# Patient Record
Sex: Female | Born: 1955
Health system: Southern US, Community
[De-identification: ages and names within clinical notes are randomized; demographics above are authoritative.]

## PROBLEM LIST (undated history)

## (undated) DIAGNOSIS — R112 Nausea with vomiting, unspecified: Secondary | ICD-10-CM

## (undated) DIAGNOSIS — T8859XA Other complications of anesthesia, initial encounter: Secondary | ICD-10-CM

## (undated) DIAGNOSIS — T4145XA Adverse effect of unspecified anesthetic, initial encounter: Secondary | ICD-10-CM

## (undated) DIAGNOSIS — K5190 Ulcerative colitis, unspecified, without complications: Secondary | ICD-10-CM

## (undated) DIAGNOSIS — I10 Essential (primary) hypertension: Secondary | ICD-10-CM

## (undated) DIAGNOSIS — Z801 Family history of malignant neoplasm of trachea, bronchus and lung: Secondary | ICD-10-CM

## (undated) DIAGNOSIS — Z9889 Other specified postprocedural states: Secondary | ICD-10-CM

## (undated) DIAGNOSIS — K589 Irritable bowel syndrome without diarrhea: Secondary | ICD-10-CM

## (undated) DIAGNOSIS — K219 Gastro-esophageal reflux disease without esophagitis: Secondary | ICD-10-CM

## (undated) DIAGNOSIS — Z803 Family history of malignant neoplasm of breast: Secondary | ICD-10-CM

## (undated) DIAGNOSIS — F419 Anxiety disorder, unspecified: Secondary | ICD-10-CM

## (undated) DIAGNOSIS — C801 Malignant (primary) neoplasm, unspecified: Secondary | ICD-10-CM

## (undated) HISTORY — DX: Family history of malignant neoplasm of trachea, bronchus and lung: Z80.1

## (undated) HISTORY — PX: TUBAL LIGATION: SHX77

## (undated) HISTORY — DX: Essential (primary) hypertension: I10

## (undated) HISTORY — PX: TONSILLECTOMY: SHX5217

## (undated) HISTORY — DX: Irritable bowel syndrome, unspecified: K58.9

## (undated) HISTORY — DX: Family history of malignant neoplasm of breast: Z80.3

## (undated) HISTORY — DX: Ulcerative colitis, unspecified, without complications: K51.90

---
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
## 2009-12-27 ENCOUNTER — Other Ambulatory Visit: Admission: RE | Admit: 2009-12-27 | Discharge: 2009-12-27 | Payer: Self-pay | Admitting: Radiology

## 2017-08-22 DIAGNOSIS — E785 Hyperlipidemia, unspecified: Secondary | ICD-10-CM | POA: Insufficient documentation

## 2018-04-14 ENCOUNTER — Ambulatory Visit (INDEPENDENT_AMBULATORY_CARE_PROVIDER_SITE_OTHER): Payer: Self-pay | Admitting: Orthopaedic Surgery

## 2018-04-21 ENCOUNTER — Ambulatory Visit (INDEPENDENT_AMBULATORY_CARE_PROVIDER_SITE_OTHER): Payer: BLUE CROSS/BLUE SHIELD

## 2018-04-21 ENCOUNTER — Encounter (INDEPENDENT_AMBULATORY_CARE_PROVIDER_SITE_OTHER): Payer: Self-pay | Admitting: Orthopaedic Surgery

## 2018-04-21 ENCOUNTER — Ambulatory Visit (INDEPENDENT_AMBULATORY_CARE_PROVIDER_SITE_OTHER): Payer: BLUE CROSS/BLUE SHIELD | Admitting: Orthopaedic Surgery

## 2018-04-21 DIAGNOSIS — G8929 Other chronic pain: Secondary | ICD-10-CM

## 2018-04-21 DIAGNOSIS — M25561 Pain in right knee: Secondary | ICD-10-CM | POA: Diagnosis not present

## 2018-04-21 DIAGNOSIS — M898X6 Other specified disorders of bone, lower leg: Secondary | ICD-10-CM

## 2018-04-21 MED ORDER — DICLOFENAC SODIUM 1 % TD GEL: 2.0000 g | Freq: Four times a day (QID) | TRANSDERMAL | 5 refills | Status: DC

## 2018-04-21 NOTE — Progress Notes (Signed)
Office Visit Note   Patient: Donna Whitney           Date of Birth: 02/07/1956           MRN: [id number] Visit Date: 04/21/2018              Requested by: No referring provider defined for this encounter. PCP: Asencion Noble, MD   Assessment & Plan: Visit Diagnoses:  1. Chronic pain of right knee   2. Pain of right tibia     Plan: Impression is mild right knee osteoarthritis and right tibial shaft pain.  The knee this is overall mild and I have given her a prescription for Voltaren gel.  She has a history of ulcerative colitis therefore she should not take NSAIDs.  For the tibia like to protect her weightbearing for the next 4 weeks with a Cam walking boot.  Hopefully this will settle it down.  I think this is either tendinitis from overuse or possibly a stress reaction or stress fracture.  I like to reevaluate in 4 weeks to see how she is doing.  May need to consider an MRI if she is not any better.  Follow-Up Instructions: Return in about 1 month (around 05/19/2018).   Orders:  Orders Placed This Encounter  Procedures  . XR Knee Complete 4 Views Right  . XR Tibia/Fibula Right   Meds ordered this encounter  Medications  . diclofenac sodium (VOLTAREN) 1 % GEL    Sig: Apply 2 g topically 4 (four) times daily.    Dispense:  1 Tube    Refill:  5      Procedures: No procedures performed   Clinical Data: No additional findings.   Subjective: Chief Complaint  Patient presents with  . Right Knee - Pain    Donna Whitney is a very pleasant 62 year old female comes in with right knee and shin pain.  She is the husband of Donna Whitney who is hip replacement performed a couple years back.  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  She has been walking a lot.  She states that there is discomfort in her right knee that will sometimes radiate to the back of the knee.  Denies any significant swelling or chest pain or shortness of breath.  She will  have some pain at rest in her shin bone near her ankle.   Review of Systems  Constitutional: Negative.   HENT: Negative.   Eyes: Negative.   Respiratory: Negative.   Cardiovascular: Negative.   Endocrine: Negative.   Musculoskeletal: Negative.   Neurological: Negative.   Hematological: Negative.   Psychiatric/Behavioral: Negative.   All other systems reviewed and are negative.    Objective: Vital Signs: There were no vitals taken for this visit.  Physical Exam  Constitutional: She is oriented to person, place, and time. She appears well-developed and well-nourished.  HENT:  Head: Normocephalic and atraumatic.  Eyes: EOM are normal.  Neck: Neck supple.  Pulmonary/Chest: Effort normal.  Abdominal: Soft.  Neurological: She is alert and oriented to person, place, and time.  Skin: Skin is warm. Capillary refill takes less than 2 seconds.  Psychiatric: She has a normal mood and affect. Her behavior is normal. Judgment and thought content normal.  Nursing note and vitals reviewed.   Ortho Exam Right knee exam shows no joint effusion.  Collaterals and cruciates are stable.  1+ patellofemoral crepitus.  No joint line tenderness.  Right tibia exam  shows mild pitting edema that is symmetric.  No neurovascular compromise.  She is slightly tender in the tibial crest.  No significant pain with resisted ankle dorsiflexion. Specialty Comments:  No specialty comments available.  Imaging: Xr Knee Complete 4 Views Right  Result Date: 04/21/2018 Mild arthritis.  No significant joint space narrowing  Xr Tibia/fibula Right  Result Date: 04/21/2018 No acute or structural abnormalities.    PMFS History: There are no active problems to display for this patient.  History reviewed. No pertinent past medical history.  History reviewed. No pertinent family history.  History reviewed. No pertinent surgical history. Social History   Occupational History  . Not on file  Tobacco Use  .  Smoking status: Never Smoker  . Smokeless tobacco: Never Used  Substance and Sexual Activity  . Alcohol use: Not on file  . Drug use: Not on file  . Sexual activity: Not on file

## 2018-05-01 ENCOUNTER — Ambulatory Visit (HOSPITAL_COMMUNITY)
Admission: RE | Admit: 2018-05-01 | Discharge: 2018-05-01 | Disposition: A | Discharge status: Home / Self Care | Payer: BLUE CROSS/BLUE SHIELD | Source: Ambulatory Visit | Attending: Internal Medicine | Admitting: Internal Medicine

## 2018-05-01 ENCOUNTER — Other Ambulatory Visit (HOSPITAL_COMMUNITY): Payer: Self-pay | Admitting: Internal Medicine

## 2018-05-01 DIAGNOSIS — M79661 Pain in right lower leg: Secondary | ICD-10-CM

## 2018-05-01 DIAGNOSIS — M7989 Other specified soft tissue disorders: Secondary | ICD-10-CM

## 2018-05-01 DIAGNOSIS — R2241 Localized swelling, mass and lump, right lower limb: Secondary | ICD-10-CM | POA: Insufficient documentation

## 2018-05-01 DIAGNOSIS — I82402 Acute embolism and thrombosis of unspecified deep veins of left lower extremity: Secondary | ICD-10-CM | POA: Insufficient documentation

## 2018-05-19 ENCOUNTER — Encounter (INDEPENDENT_AMBULATORY_CARE_PROVIDER_SITE_OTHER): Payer: Self-pay | Admitting: Orthopaedic Surgery

## 2018-05-19 ENCOUNTER — Ambulatory Visit (INDEPENDENT_AMBULATORY_CARE_PROVIDER_SITE_OTHER): Payer: BLUE CROSS/BLUE SHIELD | Admitting: Orthopaedic Surgery

## 2018-05-19 DIAGNOSIS — M898X6 Other specified disorders of bone, lower leg: Secondary | ICD-10-CM | POA: Diagnosis not present

## 2018-05-19 NOTE — Progress Notes (Signed)
   Office Visit Note   Patient: Donna Whitney           Date of Birth: 1955-12-22           MRN: [id number] Visit Date: 05/19/2018              Requested by: Asencion Noble, MD [address] [place], [place] [address] PCP: Asencion Noble, MD   Assessment & Plan: Visit Diagnoses:  1. Pain of right tibia     Plan: At this point we are going to try to wean her out of the cam walker to see how she does.  She has recurrence of pain immediately for any worsening then we need to get an MRI of the tibi to rule out stress fracture.a if she continues to improve that she can follow-up as needed.  Follow-Up Instructions: Return if symptoms worsen or fail to improve.   Orders:  No orders of the defined types were placed in this encounter.  No orders of the defined types were placed in this encounter.     Procedures: No procedures performed   Clinical Data: No additional findings.   Subjective: Chief Complaint  Patient presents with  . Right Knee - Follow-up  . Right Leg - Follow-up    Donna Whitney follows up today for her right ankle and tibia.  She feels much better.  She states she is about 90% better.  It has significantly helped.  She received a Doppler which was negative for DVT.   Review of Systems   Objective: Vital Signs: There were no vitals taken for this visit.  Physical Exam  Ortho Exam Right knee exam is unremarkable.  She still has some mild discomfort with palpation along the anterior tibial crest.  Pain is significantly better. Specialty Comments:  No specialty comments available.  Imaging: No results found.   PMFS History: There are no active problems to display for this patient.  History reviewed. No pertinent past medical history.  History reviewed. No pertinent family history.  History reviewed. No pertinent surgical history. Social History   Occupational History  . Not on file  Tobacco Use  . Smoking status: Never Smoker  . Smokeless tobacco:  Never Used  Substance and Sexual Activity  . Alcohol use: Not on file  . Drug use: Not on file  . Sexual activity: Not on file

## 2019-03-30 IMAGING — US US EXTREM LOW VENOUS*R*
1 series · 13 of 24 positions shown · non-contrast
Comparison: None.

CLINICAL DATA: 61-year-old female with right lower extremity pain
and swelling



[Series 1: us extrem low venous*right* · 0.08mm/px · 13 of 36 slices shown]
[im 1/36]
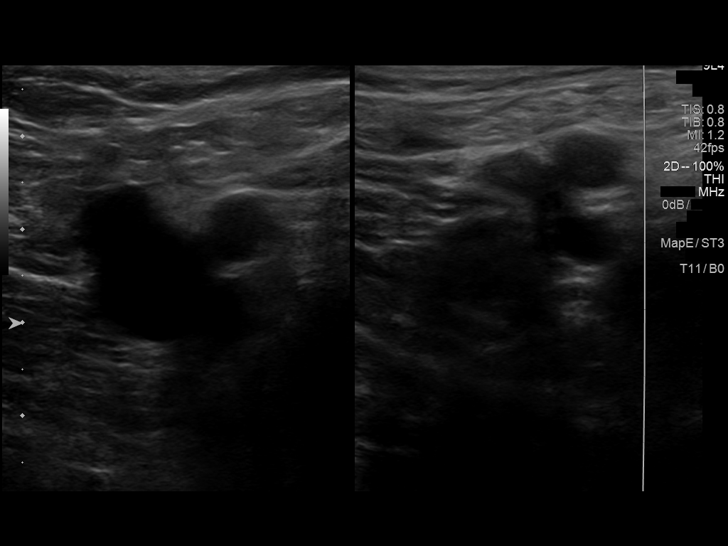
[im 4/36]
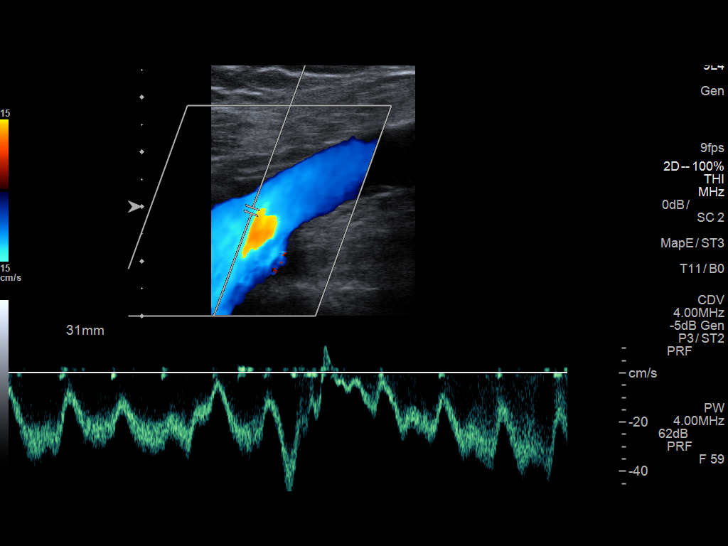
[im 7/36]
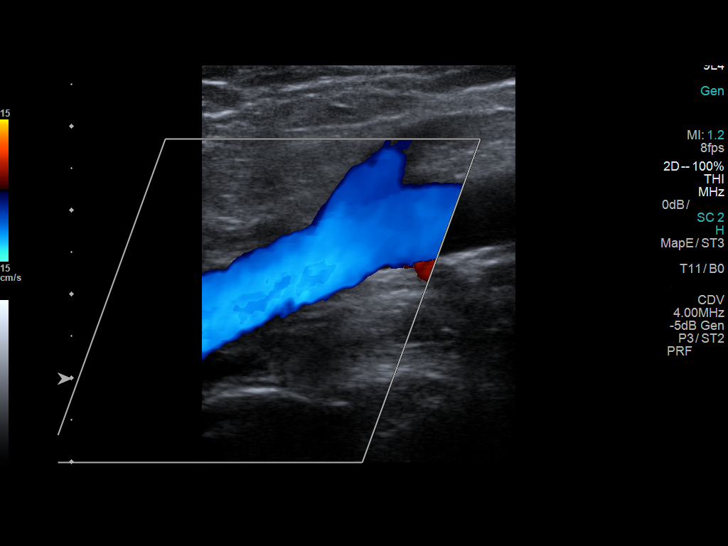
[im 10/36]
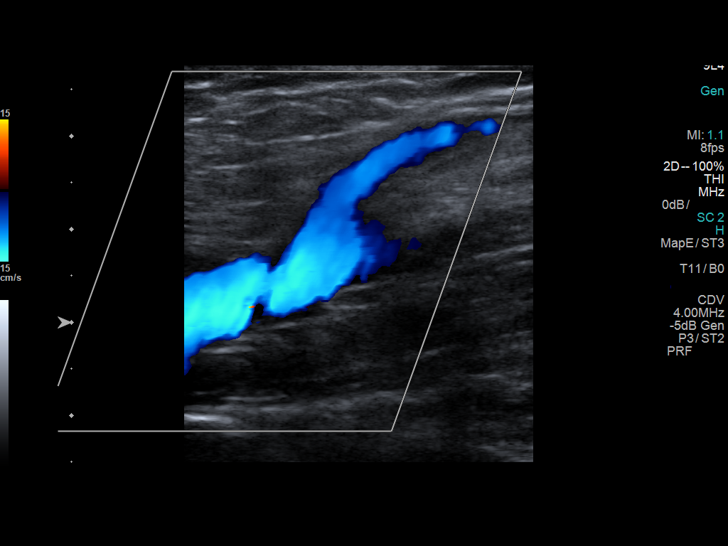
[im 13/36]
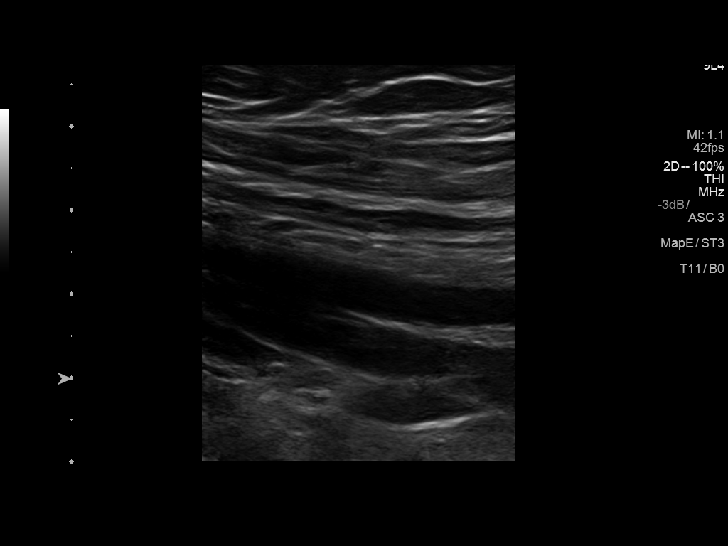
[im 16/36]
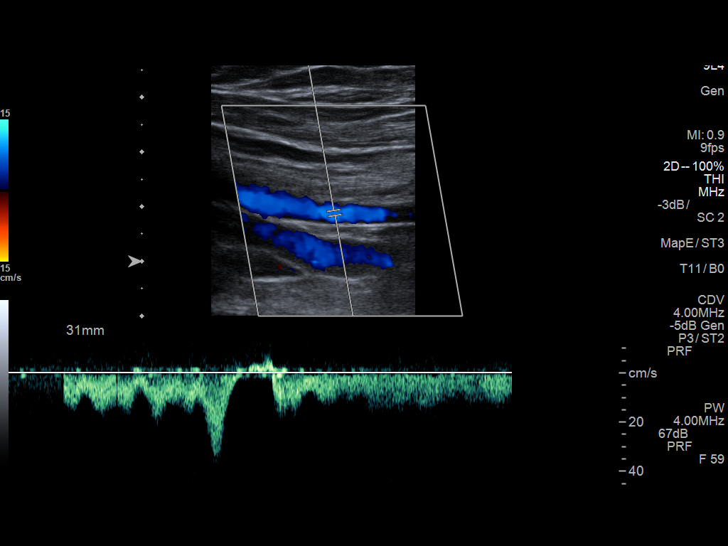
[im 19/36]
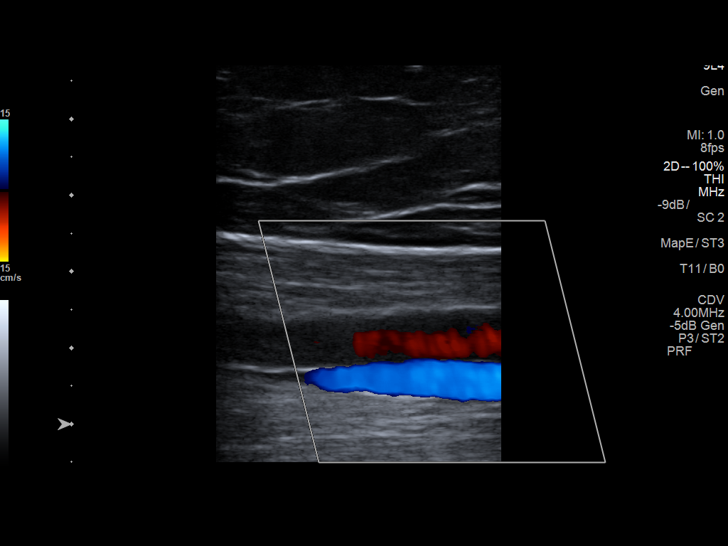
[im 20/36]
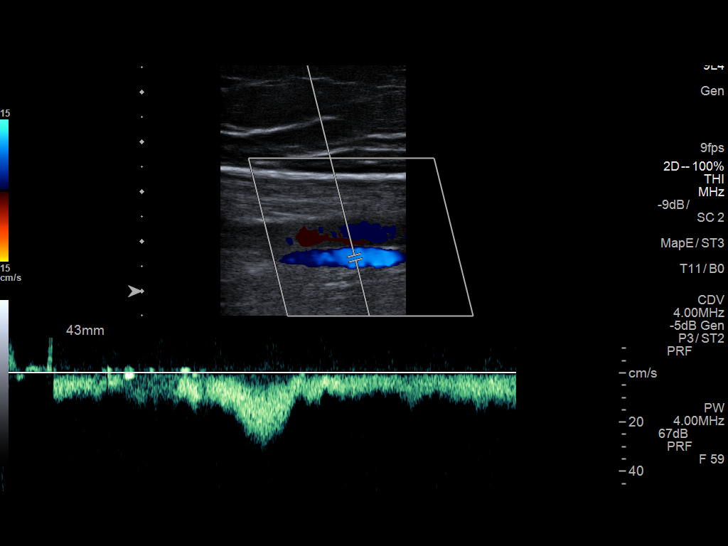
[im 23/36]
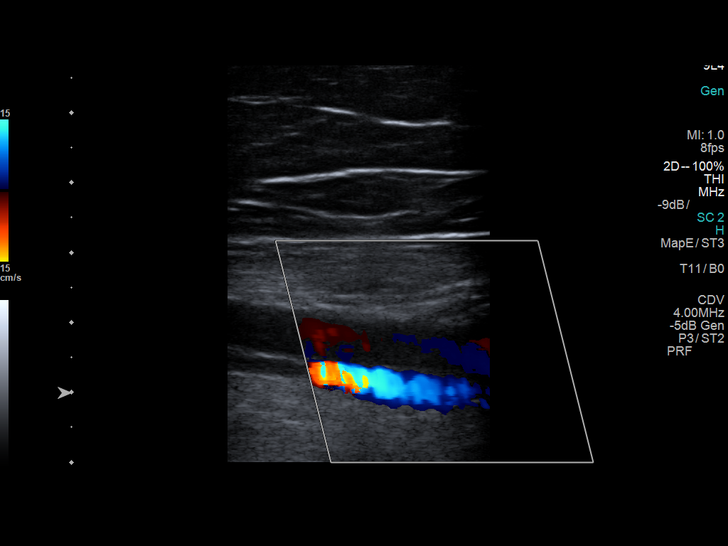
[im 26/36]
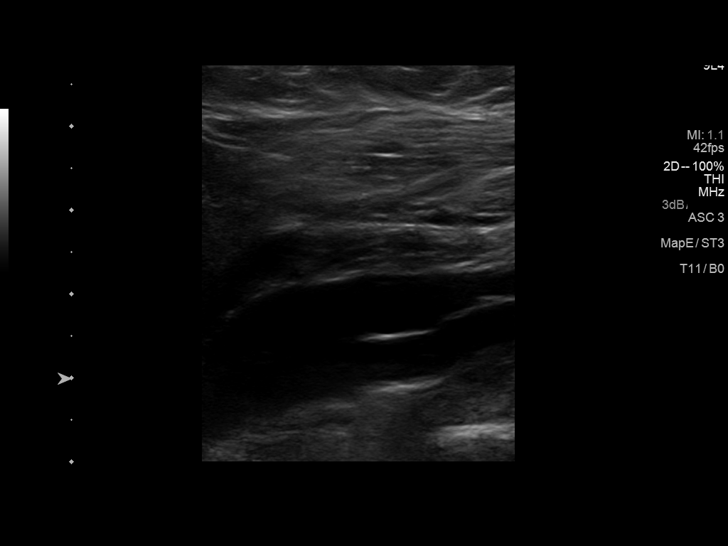
[im 29/36]
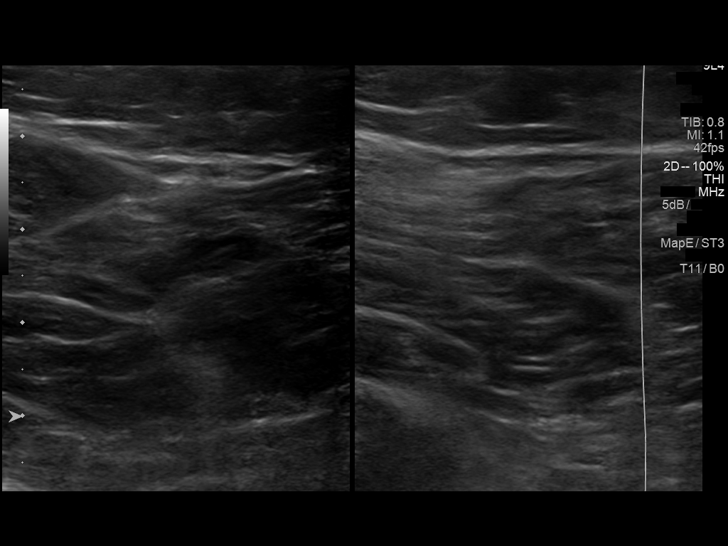
[im 32/36]
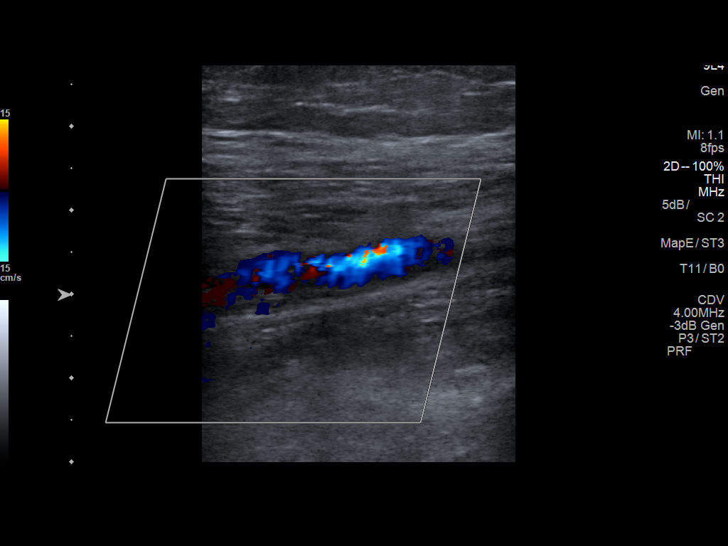
[im 36/36]
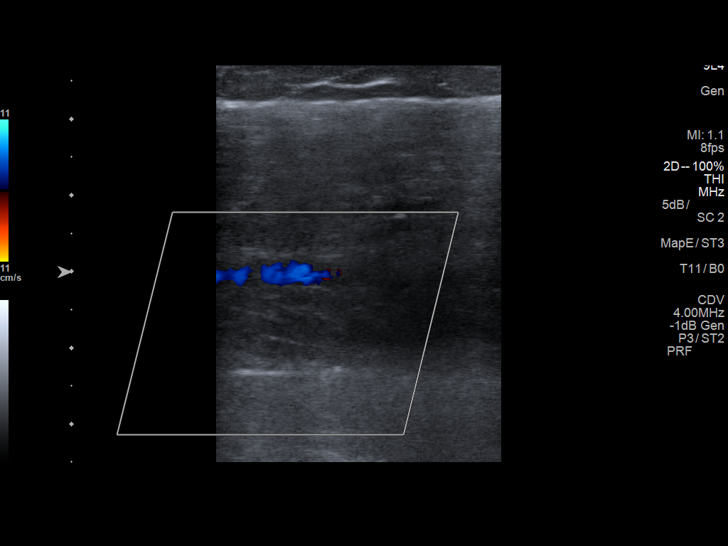

[13 of 24 positions shown; findings below may reference images not displayed]

FINDINGS: Contralateral Common Femoral Vein: Respiratory phasicity is normal
and symmetric with the symptomatic side. No evidence of thrombus.
Normal compressibility.

Common Femoral Vein: No evidence of thrombus. Normal
compressibility, respiratory phasicity and response to augmentation.

Saphenofemoral Junction: No evidence of thrombus. Normal
compressibility and flow on color Doppler imaging.

Profunda Femoral Vein: No evidence of thrombus. Normal
compressibility and flow on color Doppler imaging.

Femoral Vein: No evidence of thrombus. Normal compressibility,
respiratory phasicity and response to augmentation.

Popliteal Vein: No evidence of thrombus. Normal compressibility,
respiratory phasicity and response to augmentation.

Calf Veins: No evidence of thrombus. Normal compressibility and flow
on color Doppler imaging.

Superficial Great Saphenous Vein: No evidence of thrombus. Normal
compressibility.

Venous Reflux:  None.

Other Findings:  None.
IMPRESSION: No evidence of deep venous thrombosis.

## 2019-05-19 DIAGNOSIS — C801 Malignant (primary) neoplasm, unspecified: Secondary | ICD-10-CM

## 2019-05-19 HISTORY — DX: Malignant (primary) neoplasm, unspecified: C80.1

## 2019-06-09 ENCOUNTER — Other Ambulatory Visit: Payer: Self-pay | Admitting: Radiology

## 2019-06-11 ENCOUNTER — Telehealth: Payer: Self-pay | Admitting: *Deleted

## 2019-06-11 ENCOUNTER — Encounter: Payer: Self-pay | Admitting: *Deleted

## 2019-06-11 NOTE — Telephone Encounter (Signed)
Confirmed [facility] for 06/16/19 at 1215 .  Instructions and contact information given.

## 2019-06-11 NOTE — Telephone Encounter (Signed)
Left vm regarding [place] appt for 7/29 at 12:15. Contact information provided for confirmation of appt.

## 2019-06-14 ENCOUNTER — Other Ambulatory Visit: Payer: Self-pay | Admitting: *Deleted

## 2019-06-14 DIAGNOSIS — C50211 Malignant neoplasm of upper-inner quadrant of right female breast: Secondary | ICD-10-CM | POA: Insufficient documentation

## 2019-06-14 DIAGNOSIS — Z170 Estrogen receptor positive status [ER+]: Secondary | ICD-10-CM | POA: Insufficient documentation

## 2019-06-15 NOTE — Progress Notes (Signed)
[facility]  [phone] [phone]     ID: Donna Whitney DOB: 1956-09-24  MR#: [id number]  SAY#:[id number]  Patient Care Team: Asencion Noble, MD as PCP - General (Internal Medicine) Mauro Kaufmann, RN as Oncology Nurse Navigator Rockwell Germany, RN as Oncology Nurse Navigator Erroll Luna, MD as Consulting Physician (General Surgery) Auriel Kist, Virgie Dad, MD as Consulting Physician (Oncology) Kyung Rudd, MD as Consulting Physician (Radiation Oncology) Linda Hedges, DO as Consulting Physician (Obstetrics and Gynecology) Richmond Campbell, MD as Consulting Physician (Gastroenterology) Chauncey Cruel, MD OTHER MD:  CHIEF COMPLAINT: estrogen receptor positive breast cancer  CURRENT TREATMENT: Definitive surgery pending   HISTORY OF CURRENT ILLNESS: [name] presented with a 2 week history of a palpable area in the upper-inner quadrant of her right breast. She underwent bilateral diagnostic mammography with tomography and right breast ultrasonography at [facility] on 06/03/2019 showing: breast density category B; 7 mm lobulated region in the upper-inner right breast suspicious of malignancy.  Evaluation of the right axilla if performed was not documented in the ultrasound report.  Accordingly on 06/09/2019 she proceeded to biopsy of the right breast area in question. The pathology from this procedure ([id number]) showed: invasive ductal carcinoma, grade 3. Prognostic indicators significant for: estrogen receptor, 100% positive with strong staining intensity, and progesterone receptor, 30% positive, with weak staining intensity. Proliferation marker Ki67 at 10%. HER2 equivocal by immunohistochemistry (2+), but negative by fluorescent in situ hybridization. Two sets of cells were evaluated showing a signals ratio 1.63/1.62 and number per cell 4.25/4.5.  The patient's subsequent history is as detailed below.   INTERVAL HISTORY: Rhealyn was evaluated in  the multidisciplinary breast cancer clinic on 06/16/2019. Her case was also presented at the multidisciplinary breast cancer conference on the same day. At that time a preliminary plan was proposed: Breast conserving surgery, Oncotype, adjuvant radiation, antiestrogens and genetics testing   REVIEW OF SYSTEMS: Donna Whitney reports she had a week of increased fatigue, and one day when she was lying down, she felt some stinging in her breast and palpated a pea-sized nodule. 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 The patient denies unusual headaches, visual changes, nausea, vomiting, stiff neck, dizziness, or gait imbalance. There has been no cough, phlegm production, or pleurisy, no chest pain or pressure, and no change in bowel or bladder habits. The patient denies fever, rash, bleeding, unexplained fatigue or unexplained weight loss. A detailed review of systems was otherwise entirely negative.   PAST MEDICAL HISTORY: Past Medical History:  Diagnosis Date   Hypertension    IBS (irritable bowel syndrome)    Ulcerative colitis ([place])     PAST SURGICAL HISTORY: Past Surgical History:  Procedure Laterality Date   CESAREAN SECTION     TONSILLECTOMY     as a child   TUBAL LIGATION      FAMILY HISTORY: Family History  Problem Relation Age of Onset   Lung cancer Maternal Grandmother    Breast cancer Paternal 49    Patient's father was 34 years old when he died from MRSA following back surgery, complicated by heavy smoking and a prior heart attack. Patient's mother died from heart attack at age 35. The patient denies a family hx of ovarian cancer. She reports a paternal aunt with breast cancer. She has 3 siblings, 1 brother and 2 sisters. She also notes lung cancer in her maternal grandmother.   GYNECOLOGIC HISTORY:  No  LMP recorded. Menarche: 63 years old Age at first live birth: 63 years old [place] P 2 LMP 11/2013 Contraceptive: yes, stopped  after 6 months due to elevated blood pressure HRT no  Hysterectomy? no BSO? 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 She is married. Husband Donna Whitney is her partner in this. 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 She lives at home with her husband. Son Donna Whitney, age 42, lives in [place] as a Development worker, international aid. Daughter Donna Whitney, age 86, lives in [place] as a Training and development officer for [employer]. She has two grandchildren. She attends [employer].    ADVANCED DIRECTIVES: Husband Donna Whitney is her HCPOA.   HEALTH MAINTENANCE: Social History   Tobacco Use   Smoking status: Former Smoker   Smokeless tobacco: Never Used  Substance Use Topics   Alcohol use: Yes   Drug use: Never     Colonoscopy: ~2016, Dr. Earlean Shawl  PAP: 01/2018  Bone density: ~2018, [facility], "early osteoporosis"   No Known Allergies  Current Outpatient Medications  Medication Sig Dispense Refill   famotidine (PEPCID) 20 MG tablet Take 20 mg by mouth daily. Pt only takes as needed     methocarbamol (ROBAXIN) 500 MG tablet Take 500 mg by mouth every 6 (six) hours as needed for muscle spasms.     NON FORMULARY Take 1 tablet by mouth daily. [place] High Potency Probiotic     olmesartan (BENICAR) 20 MG tablet Take 20 mg by mouth daily.     No current facility-administered medications for this visit.     OBJECTIVE: Middle-aged white woman who appears stated age  63:   06/16/19 1245  BP: (!) 172/92  Pulse: 86  Resp: 18  Temp: 99.1 F (37.3 C)  SpO2: 100%     Body mass index is 25.63 kg/m.   Wt Readings from Last 3 Encounters:  06/16/19 149 lb 4.8 oz (67.7 kg)      ECOG FS:1 - Symptomatic but completely ambulatory  Ocular: Sclerae unicteric, pupils round and equal Wearing a mask Lymphatic: No cervical or supraclavicular adenopathy Lungs no rales or rhonchi Heart regular rate and rhythm Abd soft,  nontender, positive bowel sounds MSK no focal spinal tenderness, no joint edema Neuro: non-focal, well-oriented, appropriate affect Breasts: The right breast has a significant ecchymosis from the recent biopsy and a palpable mass which is probably a hematoma.  The left breast is benign.  Both axillae are benign.   LAB RESULTS:  CMP     Component Value Date/Time   NA 140 06/16/2019 1218   K 4.0 06/16/2019 1218   CL 104 06/16/2019 1218   CO2 26 06/16/2019 1218   GLUCOSE 95 06/16/2019 1218   BUN 9 06/16/2019 1218   CREATININE 0.80 06/16/2019 1218   CALCIUM 9.8 06/16/2019 1218   PROT 7.5 06/16/2019 1218   ALBUMIN 4.6 06/16/2019 1218   AST 15 06/16/2019 1218   ALT 14 06/16/2019 1218   ALKPHOS 57 06/16/2019 1218   BILITOT 0.8 06/16/2019 1218   GFRNONAA >60 06/16/2019 1218   GFRAA >60 06/16/2019 1218    No results found for: TOTALPROTELP, ALBUMINELP, A1GS, A2GS, BETS, BETA2SER, GAMS, MSPIKE, SPEI  No results found for: KPAFRELGTCHN, LAMBDASER, KAPLAMBRATIO  Lab Results  Component Value Date   WBC 8.7 06/16/2019   NEUTROABS 5.5 06/16/2019   HGB 12.5 06/16/2019   HCT 38.2 06/16/2019   MCV 87.6 06/16/2019  PLT 241 06/16/2019    _0 @  No results found for: LABCA2  No components found for: JEHUDJ497  No results for input(s): INR in the last 168 hours.  No results found for: LABCA2  No results found for: WYO378  No results found for: HYI502  No results found for: DXA128  No results found for: CA2729  No components found for: HGQUANT  No results found for: CEA1 / No results found for: CEA1   No results found for: AFPTUMOR  No results found for: CHROMOGRNA  No results found for: PSA1  Appointment on 06/16/2019  Component Date Value Ref Range Status   Sodium 06/16/2019 140  135 - 145 mmol/L Final   Potassium 06/16/2019 4.0  3.5 - 5.1 mmol/L Final   Chloride 06/16/2019 104  98 - 111 mmol/L Final   CO2 06/16/2019 26  22 - 32 mmol/L Final    Glucose, Bld 06/16/2019 95  70 - 99 mg/dL Final   BUN 06/16/2019 9  8 - 23 mg/dL Final   Creatinine 06/16/2019 0.80  0.44 - 1.00 mg/dL Final   Calcium 06/16/2019 9.8  8.9 - 10.3 mg/dL Final   Total Protein 06/16/2019 7.5  6.5 - 8.1 g/dL Final   Albumin 06/16/2019 4.6  3.5 - 5.0 g/dL Final   AST 06/16/2019 15  15 - 41 U/L Final   ALT 06/16/2019 14  0 - 44 U/L Final   Alkaline Phosphatase 06/16/2019 57  38 - 126 U/L Final   Total Bilirubin 06/16/2019 0.8  0.3 - 1.2 mg/dL Final   GFR, Est Non Af Am 06/16/2019 >60  >60 mL/min Final   GFR, Est AFR Am 06/16/2019 >60  >60 mL/min Final   Anion gap 06/16/2019 10  5 - 15 Final   Performed at [facility], [place] [address] [place], [place] [address]   WBC Count 06/16/2019 8.7  4.0 - 10.5 K/uL Final   RBC 06/16/2019 4.36  3.87 - 5.11 MIL/uL Final   Hemoglobin 06/16/2019 12.5  12.0 - 15.0 g/dL Final   HCT 06/16/2019 38.2  36.0 - 46.0 % Final   MCV 06/16/2019 87.6  80.0 - 100.0 fL Final   MCH 06/16/2019 28.7  26.0 - 34.0 pg Final   MCHC 06/16/2019 32.7  30.0 - 36.0 g/dL Final   RDW 06/16/2019 13.6  11.5 - 15.5 % Final   Platelet Count 06/16/2019 241  150 - 400 K/uL Final   nRBC 06/16/2019 0.0  0.0 - 0.2 % Final   Neutrophils Relative % 06/16/2019 63  % Final   Neutro Abs 06/16/2019 5.5  1.7 - 7.7 K/uL Final   Lymphocytes Relative 06/16/2019 28  % Final   Lymphs Abs 06/16/2019 2.4  0.7 - 4.0 K/uL Final   Monocytes Relative 06/16/2019 7  % Final   Monocytes Absolute 06/16/2019 0.6  0.1 - 1.0 K/uL Final   Eosinophils Relative 06/16/2019 1  % Final   Eosinophils Absolute 06/16/2019 0.1  0.0 - 0.5 K/uL Final   Basophils Relative 06/16/2019 1  % Final   Basophils Absolute 06/16/2019 0.0  0.0 - 0.1 K/uL Final   Immature Granulocytes 06/16/2019 0  % Final   Abs Immature Granulocytes 06/16/2019 0.03  0.00 - 0.07 K/uL Final   Performed at [facility], [place] [address] [place], [place] [address]    (this displays the last labs from the last 3 days)  No results found for: TOTALPROTELP, ALBUMINELP, A1GS, A2GS, BETS, BETA2SER,  GAMS, MSPIKE, SPEI (this displays SPEP labs)  No results found for: KPAFRELGTCHN, LAMBDASER, KAPLAMBRATIO (kappa/lambda light chains)  No results found for: HGBA, HGBA2QUANT, HGBFQUANT, HGBSQUAN (Hemoglobinopathy evaluation)   No results found for: LDH  No results found for: IRON, TIBC, IRONPCTSAT (Iron and TIBC)  No results found for: FERRITIN  Urinalysis No results found for: COLORURINE, APPEARANCEUR, LABSPEC, PHURINE, GLUCOSEU, HGBUR, BILIRUBINUR, KETONESUR, PROTEINUR, UROBILINOGEN, NITRITE, LEUKOCYTESUR   STUDIES: No results found.  ELIGIBLE FOR AVAILABLE RESEARCH PROTOCOL: no  ASSESSMENT: 63 y.o. [place]us [place], [place] woman status post right breast upper inner quadrant biopsy 06/09/2019 for a clinical T1b NX, stage IA invasive ductal carcinoma, grade 3, estrogen and progesterone receptor positive, HER-2 nonamplified, with an MRD-1 of 10%.  (1) definitive surgery pending  (2) Oncotype to be obtained from the definitive surgical sample: Chemotherapy not anticipated  (3) adjuvant radiation  (4) antiestrogens to start at the completion of local treatment.  (5) genetics testing  PLAN: I spent approximately 60 minutes face to face with Anelia with more than 50% of that time spent in counseling and coordination of care. Specifically we reviewed the biology of the patient's diagnosis and the specifics of her situation.  We first reviewed the fact that cancer is not one disease but more than 100 different diseases and that it is important to keep them separate-- otherwise when friends and relatives discuss their own cancer experiences with Rendi confusion can result. Similarly we explained that if breast cancer spreads to the bone or liver, the patient would not have bone cancer or liver cancer, but breast cancer in the bone and  breast cancer in the liver: one cancer in three places-- not 3 different cancers which otherwise would have to be treated in 3 different ways.  We discussed the difference between local and systemic therapy. In terms of loco-regional treatment, lumpectomy plus radiation is equivalent to mastectomy as far as survival is concerned. For this reason, and because the cosmetic results are generally superior, we recommend breast conserving surgery.   We then discussed the rationale for systemic therapy. There is some risk that this cancer may have already spread to other parts of her body. Patients frequently ask at this point about bone scans, CAT scans and PET scans to find out if they have occult breast cancer somewhere else. The problem is that in early stage disease we are much more likely to find false positives then true cancers and this would expose the patient to unnecessary procedures as well as unnecessary radiation. Scans cannot answer the question the patient really would like to know, which is whether she has microscopic disease elsewhere in her body. For those reasons we do not recommend them.  Of course we would proceed to aggressive evaluation of any symptoms that might suggest metastatic disease, but that is not the case here.  Next we went over the options for systemic therapy which are anti-estrogens, anti-HER-2 immunotherapy, and chemotherapy. Solange does not meet criteria for anti-HER-2 immunotherapy. She is a good candidate for anti-estrogens.  The question of chemotherapy is more complicated. Chemotherapy is most effective in aggressive tumors, like this 1. It is much less effective in slow growing cancers, like Velmer 's.  In chart we cannot predict the benefit of chemotherapy from the data we have and we are going to request an Oncotype from the definitive surgical sample, as suggested by NCCN guidelines. That will help [place] make a definitive decision regarding chemotherapy in this  case.  She also qualifies for  genetics testing. In patients who carry a deleterious mutation [for example in a  BRCA gene], the risk of a new breast cancer developing in the future may be sufficiently great that the patient may choose bilateral mastectomies. However if she wishes to keep her breasts in that situation it is safe to do so. That would require intensified screening, which generally means not only yearly mammography but a yearly breast MRI as well.  Abrielle is very clear that even if she did carry a deleterious mutation she would prefer intensified screening.  The plan then is to start with surgery, use the Oncotype to help on the chemotherapy decision, and then follow-up with radiation and antiestrogens.  Rosetta has a good understanding of the overall plan. She agrees with it. She knows the goal of treatment in her case is cure. She will call with any problems that may develop before her next visit here.   Chauncey Cruel, MD   06/16/2019 2:43 PM Medical Oncology and Hematology [facility] [address] [place], [place] [address] Tel. [phone]    Fax. [phone]   This document serves as a record of services personally performed by Lurline Del, MD. It was created on his behalf by Wilburn Mylar, a trained medical scribe. The creation of this record is based on the scribe's personal observations and the provider's statements to them.   I, Lurline Del MD, have reviewed the above documentation for accuracy and completeness, and I agree with the above.

## 2019-06-16 ENCOUNTER — Encounter: Payer: Self-pay | Admitting: Oncology

## 2019-06-16 ENCOUNTER — Ambulatory Visit: Payer: BLUE CROSS/BLUE SHIELD | Admitting: Licensed Clinical Social Worker

## 2019-06-16 ENCOUNTER — Encounter: Payer: Self-pay | Admitting: Physical Therapy

## 2019-06-16 ENCOUNTER — Ambulatory Visit: Payer: BLUE CROSS/BLUE SHIELD | Attending: Surgery | Admitting: Physical Therapy

## 2019-06-16 ENCOUNTER — Inpatient Hospital Stay: Payer: BLUE CROSS/BLUE SHIELD

## 2019-06-16 ENCOUNTER — Ambulatory Visit: Payer: Self-pay | Admitting: Surgery

## 2019-06-16 ENCOUNTER — Ambulatory Visit
Admission: RE | Admit: 2019-06-16 | Discharge: 2019-06-16 | Disposition: A | Discharge status: Home / Self Care | Payer: BLUE CROSS/BLUE SHIELD | Source: Ambulatory Visit | Attending: Radiation Oncology | Admitting: Radiation Oncology

## 2019-06-16 ENCOUNTER — Encounter: Payer: Self-pay | Admitting: Licensed Clinical Social Worker

## 2019-06-16 ENCOUNTER — Other Ambulatory Visit: Payer: Self-pay

## 2019-06-16 ENCOUNTER — Inpatient Hospital Stay: Payer: BLUE CROSS/BLUE SHIELD | Attending: Oncology | Admitting: Oncology

## 2019-06-16 VITALS — BP 172/92 | HR 86 | Temp 99.1°F | Resp 18 | Ht 64.0 in | Wt 149.3 lb

## 2019-06-16 DIAGNOSIS — Z170 Estrogen receptor positive status [ER+]: Secondary | ICD-10-CM

## 2019-06-16 DIAGNOSIS — C50211 Malignant neoplasm of upper-inner quadrant of right female breast: Secondary | ICD-10-CM

## 2019-06-16 DIAGNOSIS — Z803 Family history of malignant neoplasm of breast: Secondary | ICD-10-CM | POA: Insufficient documentation

## 2019-06-16 DIAGNOSIS — R293 Abnormal posture: Secondary | ICD-10-CM | POA: Diagnosis present

## 2019-06-16 DIAGNOSIS — Z801 Family history of malignant neoplasm of trachea, bronchus and lung: Secondary | ICD-10-CM | POA: Insufficient documentation

## 2019-06-16 DIAGNOSIS — Z87891 Personal history of nicotine dependence: Secondary | ICD-10-CM

## 2019-06-16 DIAGNOSIS — C50911 Malignant neoplasm of unspecified site of right female breast: Secondary | ICD-10-CM

## 2019-06-16 LAB — CMP (CANCER CENTER ONLY)
ALT: 14 U/L (ref 0–44)
AST: 15 U/L (ref 15–41)
Albumin: 4.6 g/dL (ref 3.5–5.0)
Alkaline Phosphatase: 57 U/L (ref 38–126)
Anion gap: 10 (ref 5–15)
BUN: 9 mg/dL (ref 8–23)
CO2: 26 mmol/L (ref 22–32)
Calcium: 9.8 mg/dL (ref 8.9–10.3)
Chloride: 104 mmol/L (ref 98–111)
Creatinine: 0.8 mg/dL (ref 0.44–1.00)
GFR, Est AFR Am: >60 mL/min (ref >60)
GFR, Estimated: >60 mL/min (ref >60)
Glucose, Bld: 95 mg/dL (ref 70–99)
Potassium: 4 mmol/L (ref 3.5–5.1)
Sodium: 140 mmol/L (ref 135–145)
Total Bilirubin: 0.8 mg/dL (ref 0.3–1.2)
Total Protein: 7.5 g/dL (ref 6.5–8.1)

## 2019-06-16 LAB — CBC WITH DIFFERENTIAL (CANCER CENTER ONLY)
Abs Immature Granulocytes: 0.03 10*3/uL (ref 0.00–0.07)
Basophils Absolute: 0 10*3/uL (ref 0.0–0.1)
Basophils Relative: 1 %
Eosinophils Absolute: 0.1 10*3/uL (ref 0.0–0.5)
Eosinophils Relative: 1 %
HCT: 38.2 % (ref 36.0–46.0)
Hemoglobin: 12.5 g/dL (ref 12.0–15.0)
Immature Granulocytes: 0 %
Lymphocytes Relative: 28 %
Lymphs Abs: 2.4 10*3/uL (ref 0.7–4.0)
MCH: 28.7 pg (ref 26.0–34.0)
MCHC: 32.7 g/dL (ref 30.0–36.0)
MCV: 87.6 fL (ref 80.0–100.0)
Monocytes Absolute: 0.6 10*3/uL (ref 0.1–1.0)
Monocytes Relative: 7 %
Neutro Abs: 5.5 10*3/uL (ref 1.7–7.7)
Neutrophils Relative %: 63 %
Platelet Count: 241 10*3/uL (ref 150–400)
RBC: 4.36 MIL/uL (ref 3.87–5.11)
RDW: 13.6 % (ref 11.5–15.5)
WBC Count: 8.7 10*3/uL (ref 4.0–10.5)
nRBC: 0 % (ref 0.0–0.2)

## 2019-06-16 NOTE — Therapy (Signed)
[place], [place], [address] Phone: [phone]   Fax:  [phone]  Physical Therapy Evaluation  Patient Details  Name: Donna Whitney MRN: [id number] Date of Birth: March 16, 1956 Referring Provider (PT): Dr. Erroll Luna   Encounter Date: 06/16/2019  PT End of Session - 06/16/19 1602    Visit Number  1    Number of Visits  2    Date for PT Re-Evaluation  08/11/19    PT Start Time  5397    PT Stop Time  1457    PT Time Calculation (min)  25 min    Activity Tolerance  Patient tolerated treatment well    Behavior During Therapy  [facility] for tasks assessed/performed       Past Medical History:  Diagnosis Date  . Family history of breast cancer   . Family history of lung cancer   . Hypertension   . IBS (irritable bowel syndrome)   . Ulcerative colitis [facility])     Past Surgical History:  Procedure Laterality Date  . CESAREAN SECTION    . TONSILLECTOMY     as a child  . TUBAL LIGATION      There were no vitals filed for this visit.   Subjective Assessment - 06/16/19 1441    Subjective  Patient reports she is here today to be seen by her medical team for her newly diagnosed right breast cancer.    Pertinent History  Patient was diagnosed on 06/03/2019 with right grade III invasive ductal carcinoma breast cancer. It meausres 7 mm and is lcoated in the upper inner quadrant. It is ER/PR positive and HER2 negative with a Ki67 of 10%.    Currently in Pain?  No/denies         [facility] PT Assessment - 06/16/19 0001      Assessment   Medical Diagnosis  Right breast cancer    Referring Provider (PT)  Dr. Marcello Moores Cornett    Onset Date/Surgical Date  06/03/19    Hand Dominance  Right    Prior Therapy  none      Precautions   Precautions  Other (comment)    Precaution Comments  active cancer      Restrictions   Weight Bearing Restrictions  No      Balance Screen   Has the patient fallen in the past  6 months  No    Has the patient had a decrease in activity level because of a fear of falling?   No    Is the patient reluctant to leave their home because of a fear of falling?   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 [employer]  She walks 30 min/day      Cognition   Overall Cognitive Status  Within Functional Limits for tasks assessed      Posture/Postural Control   Posture/Postural Control  Postural limitations    Postural Limitations  Rounded Shoulders;Forward head      ROM / Strength   AROM / PROM / Strength  AROM;Strength      AROM  Overall AROM Comments  Cervical AROM is WNL    AROM Assessment Site  Shoulder    Right/Left Shoulder  Left;Right    Right Shoulder Extension  60 Degrees    Right Shoulder Flexion  147 Degrees    Right Shoulder ABduction  160 Degrees    Right Shoulder Internal Rotation  78 Degrees    Right Shoulder External Rotation  68 Degrees    Left Shoulder Extension  50 Degrees    Left Shoulder Flexion  158 Degrees    Left Shoulder ABduction  161 Degrees    Left Shoulder Internal Rotation  70 Degrees    Left Shoulder External Rotation  85 Degrees      Strength   Overall Strength  Within functional limits for tasks performed        LYMPHEDEMA/ONCOLOGY QUESTIONNAIRE - 06/16/19 1600      Type   Cancer Type  Right breast cancer      Lymphedema Assessments   Lymphedema Assessments  Upper extremities      Right Upper Extremity Lymphedema   10 cm Proximal to Olecranon Process  28 cm    Olecranon Process  24.1 cm    10 cm Proximal to Ulnar Styloid Process  19.5 cm    Just Proximal to Ulnar Styloid Process  14.7 cm    Across Hand at [employer]  18.1 cm    At [place] of 2nd Digit  5.8 cm      Left Upper Extremity Lymphedema   10 cm Proximal to Olecranon Process  27.3 cm    Olecranon Process  24.3 cm    10 cm Proximal to Ulnar Styloid Process  19.2 cm    Just Proximal to Ulnar Styloid Process  14.9 cm    Across Hand at [employer]  18.2 cm    At [place] of 2nd Digit  5.8 cm          Quick Dash - 06/16/19 0001    Open a tight or new jar  Mild difficulty    Do heavy household chores (wash walls, wash floors)  No difficulty    Carry a shopping bag or briefcase  No difficulty    Wash your back  No difficulty    Use a knife to cut food  No difficulty    Recreational activities in which you take some force or impact through your arm, shoulder, or hand (golf, hammering, tennis)  No difficulty    During the past week, to what extent has your arm, shoulder or hand problem interfered with your normal social activities with family, friends, neighbors, or groups?  Not at all    During the past week, to what extent has your arm, shoulder or hand problem limited your work or other regular daily activities  Not at all    Arm, shoulder, or hand pain.  None    Tingling (pins and needles) in your arm, shoulder, or hand  None    Difficulty Sleeping  No difficulty    DASH Score  2.27 %        Objective measurements completed on examination: See above findings.       Patient was instructed today in a home exercise program today for post op shoulder range of motion. These included active assist shoulder flexion in sitting, scapular retraction, wall walking with shoulder abduction, and hands behind head external rotation.  She was encouraged to do these twice a day, holding 3 seconds  and repeating 5 times when permitted by her physician.           PT Education - 06/16/19 1601    Education Details  Lymphedema risk reduction and HEP education    Person(s) Educated  Patient    Methods  Explanation;Demonstration;Handout     Comprehension  Verbalized understanding;Returned demonstration          PT Long Term Goals - 06/16/19 1608      PT LONG TERM GOAL #1   Title  Patient will demonstrate she has regained full shoulder ROM and function post operatively compared to baseline assessment.    Time  8    Period  Weeks    Status  New      Breast Clinic Goals - 06/16/19 1608      Patient will be able to verbalize understanding of pertinent lymphedema risk reduction practices relevant to her diagnosis specifically related to skin care.   Time  1    Period  Days    Status  Achieved      Patient will be able to return demonstrate and/or verbalize understanding of the post-op home exercise program related to regaining shoulder range of motion.   Time  1    Period  Days    Status  Achieved      Patient will be able to verbalize understanding of the importance of attending the postoperative After Breast Cancer Class for further lymphedema risk reduction education and therapeutic exercise.   Time  1    Period  Days    Status  Achieved            Plan - 06/16/19 1603    Clinical Impression Statement  Patient was diagnosed on 06/03/2019 with right grade III invasive ductal carcinoma breast cancer. It meausres 7 mm and is lcoated in the upper inner quadrant. It is ER/PR positive and HER2 negative with a Ki67 of 10%. Her multidisciplinary medical team met prior to her assessments to determine a recommended treatment plan. She is planning to have a right lumpectomy and sentinel node biopsy followed by Oncotype testing if > 5 mm, radiation, and anti-estrogen therapy. She will benefit from a post op PT visit to reassess and determine needs.    Stability/Clinical Decision Making  Stable/Uncomplicated    Clinical Decision Making  Low    Rehab Potential  Excellent    PT Frequency  --   Eval and 1 f/u visit   PT Treatment/Interventions  ADLs/Self Care Home Management;Patient/family education;Therapeutic exercise     PT Next Visit Plan  Will reassess 3-4 weeks post op to determine needs    PT Home Exercise Plan  Post op shoulder ROM HEP    Consulted and Agree with Plan of Care  Patient       Patient will benefit from skilled therapeutic intervention in order to improve the following deficits and impairments:  Pain, Impaired UE functional use, Decreased knowledge of precautions, Postural dysfunction, Decreased range of motion  Visit Diagnosis: 1. Malignant neoplasm of upper-inner quadrant of right breast in female, estrogen receptor positive ([place])   2. Abnormal posture      Patient will follow up at outpatient cancer rehab 3-4 weeks following surgery.  If the patient requires physical therapy at that time, a specific plan will be dictated and sent to the referring physician for approval. The patient was educated today on appropriate basic range of motion exercises to begin post operatively and the importance of attending the  After Breast Cancer class following surgery.  Patient was educated today on lymphedema risk reduction practices as it pertains to recommendations that will benefit the patient immediately following surgery.  She verbalized good understanding.      Problem List Patient Active Problem List   Diagnosis Date Noted  . Family history of breast cancer   . Family history of lung cancer   . Malignant neoplasm of upper-inner quadrant of right breast in female, estrogen receptor positive ([place]) 06/14/2019   Annia Friendly, PT 06/16/19 4:11 PM  [place], [place], [address] Phone: [phone]   Fax:  [phone]  Name: NEJLA REASOR MRN: [id number] Date of Birth: 1956/07/10

## 2019-06-16 NOTE — H&P (Signed)
Donna Whitney Documented: 06/16/2019 7:49 AM Location: [facility] Patient #: [id number] DOB: 02-Sep-1956 Undefined / Language: Donna Whitney / Race: White Female  History of Present Illness Donna Whitney A. Donna Wagler MD; 06/16/2019 2:34 PM) Patient words: Pt seen at the request of Dr Donna Whitney for right breast mass present for 1 month under right nipple U/S shows 0.7 cm subareolar mass IDC ER POS PR POS HER 2 NEU NEGATIVE KI 67 10 % U/S AXILLA NEGATIVE NO OTHER COMPLAINTS SHE HAS HEMATOMA FROM BIOPSY.  The patient is a 63 year old female.   Medication History Donna Pummel, RN; 06/16/2019 7:50 AM) Medications Reconciled     Review of Systems (Donna Whitney A. Tifanny Dollens MD; 06/16/2019 2:32 PM) All other systems negative   Physical Exam (Donna Whitney A. Donna Winget MD; 06/16/2019 2:35 PM)  General Mental Status-Alert. General Appearance-Consistent with stated age. Hydration-Well hydrated. Voice-Normal.  Head and Neck Head-normocephalic, atraumatic with no lesions or palpable masses. Trachea-midline. Thyroid Gland Characteristics - normal size and consistency.  Chest and Lung Exam Chest and lung exam reveals -quiet, even and easy respiratory effort with no use of accessory muscles and on auscultation, normal breath sounds, no adventitious sounds and normal vocal resonance. Inspection Chest Wall - Normal. Back - normal.  Breast Note: SEVERE BRUISING RIGHT PERIAREOLAR REGION LEFT BREAST NORMAL  Neurologic Neurologic evaluation reveals -alert and oriented x 3 with no impairment of recent or remote memory. Mental Status-Normal.  Musculoskeletal Normal Exam - Left-Upper Extremity Strength Normal and Lower Extremity Strength Normal. Normal Exam - Right-Upper Extremity Strength Normal and Lower Extremity Strength Normal.  Lymphatic Head & Neck  General Head & Neck Lymphatics: Bilateral - Description - Normal. Axillary  General Axillary Region: Bilateral - Description -  Normal. Tenderness - Non Tender. Femoral & Inguinal - Did not examine.    Assessment & Plan (Donna Whitney A. Donna Rancourt MD; 06/16/2019 2:37 PM)  BREAST CANCER, RIGHT (C50.911) Impression: PT opted for right breast lumpectomy and SLN mapping Risk of lumpectomy include bleeding, infection, seroma, more surgery, use of seed/wire, wound care, cosmetic deformity and the need for other treatments, death , blood clots, death. Pt agrees to proceed. Risk of sentinel lymph node mapping include bleeding, infection, lymphedema, shoulder pain. stiffness, dye allergy. cosmetic deformity , blood clots, death, need for more surgery. Pt agres to proceed.  Current Plans You are being scheduled for surgery- Our schedulers will call you.  You should hear from our office's scheduling department within 5 working days about the location, date, and time of surgery. We try to make accommodations for patient's preferences in scheduling surgery, but sometimes the OR schedule or the surgeon's schedule prevents [place] from making those accommodations.  If you have not heard from our office [phone]) in 5 working days, call the office and ask for your surgeon's nurse.  If you have other questions about your diagnosis, plan, or surgery, call the office and ask for your surgeon's nurse.  Pt Education - [facility] Breast Cancer Information Given - [facility] "Breast Journey" Package We discussed the staging and pathophysiology of breast cancer. We discussed all of the different options for treatment for breast cancer including surgery, chemotherapy, radiation therapy, Herceptin, and antiestrogen therapy. We discussed a sentinel lymph node biopsy as she does not appear to having lymph node involvement right now. We discussed the performance of that with injection of radioactive tracer and blue dye. We discussed that she would have an incision underneath her axillary hairline. We discussed that there is a bout a 10-20% chance of  having a  positive node with a sentinel lymph node biopsy and we will await the permanent pathology to make any other first further decisions in terms of her treatment. One of these options might be to return to the operating room to perform an axillary lymph node dissection. We discussed about a 1-2% risk lifetime of chronic shoulder pain as well as lymphedema associated with a sentinel lymph node biopsy. We discussed the options for treatment of the breast cancer which included lumpectomy versus a mastectomy. We discussed the performance of the lumpectomy with a wire placement. We discussed a 10-20% chance of a positive margin requiring reexcision in the operating room. We also discussed that she may need radiation therapy or antiestrogen therapy or both if she undergoes lumpectomy. We discussed the mastectomy and the postoperative care for that as well. We discussed that there is no difference in her survival whether she undergoes lumpectomy with radiation therapy or antiestrogen therapy versus a mastectomy. There is a slight difference in the local recurrence rate being 3-5% with lumpectomy and about 1% with a mastectomy. We discussed the risks of operation including bleeding, infection, possible reoperation. She understands her further therapy will be based on what her stages at the time of her operation.  Pt Education - flb breast cancer surgery: discussed with patient and provided information. Pt Education - [facility] Breast Biopsy HCI: discussed with patient and provided information. Pt Education - ABC (After Breast Cancer) Class Info: discussed with patient and provided information.

## 2019-06-16 NOTE — Patient Instructions (Signed)

## 2019-06-16 NOTE — Progress Notes (Addendum)
error 

## 2019-06-16 NOTE — Progress Notes (Signed)
Radiation Oncology         [phone]) [phone] ________________________________  Name: Donna Whitney        MRN: [id number]  Date of Service: 06/16/2019 DOB: 02-10-1956  XB:WIOMB, Carloyn Manner, MD  Asencion Noble, MD     REFERRING PHYSICIAN: Asencion Noble, MD   DIAGNOSIS: The encounter diagnosis was Malignant neoplasm of upper-inner quadrant of right breast in female, estrogen receptor positive ([place]).   HISTORY OF PRESENT ILLNESS: Donna Whitney is a 63 y.o. female seen in the multidisciplinary breast clinic for a new diagnosis of right breast cancer. The patient was noted to have a palpable mass in the right breast for two weeks and has a history of previously benign biopsies. She underwent diagnostic imaging which revealed a 1 cm density on mammography, and on ultrasound at 1:00, there was a 74m mass. The axilla was negative for adenopathy. She underwent a biopsy of the lesion on 06/09/2019 that revealed a grade 3 invasive ductal carcinoma. Her tumor was ER/PR positive, her HER2 was equivocal by IHC but negative by FISH. Her Ki 67 was 10%. She is seen today to discuss treatment recommendations for her cancer..Marland Kitchen   PREVIOUS RADIATION THERAPY: No   PAST MEDICAL HISTORY:  Past Medical History:  Diagnosis Date  . Hypertension   . IBS (irritable bowel syndrome)   . [place]        PAST SURGICAL HISTORY: Past Surgical History:  Procedure Laterality Date  . CESAREAN SECTION    . TONSILLECTOMY     as a child  . TUBAL LIGATION       FAMILY HISTORY:  Family History  Problem Relation Age of Onset  . Lung cancer Maternal Grandmother   . Breast cancer Paternal Aunt      SOCIAL HISTORY:  reports that she has quit smoking. She has never used smokeless tobacco. She reports current alcohol use. She reports that she does not use drugs. [place]   ALLERGIES: Patient has no known allergies.   MEDICATIONS:  Current Outpatient  Medications  Medication Sig Dispense Refill  . famotidine (PEPCID) 20 MG tablet Take 20 mg by mouth daily. Pt only takes as needed    . methocarbamol (ROBAXIN) 500 MG tablet Take 500 mg by mouth every 6 (six) hours as needed for muscle spasms.    . NON FORMULARY Take 1 tablet by mouth daily. [place] High Potency Probiotic    . olmesartan (BENICAR) 20 MG tablet Take 20 mg by mouth daily.     No current facility-administered medications for this visit.      REVIEW OF SYSTEMS: On review of systems, the patient reports that she is doing well overall. Her main concern is anxiety about her diagnosis and the the effects it has on her bowel function and frequency from her history of colitis. She denies any chest pain, shortness of breath, cough, fevers, chills, night sweats, unintended weight changes. She denies any  bladder disturbances, and denies abdominal pain, nausea or vomiting. She denies any new musculoskeletal or joint aches or pains. A complete review of systems is obtained and is otherwise negative.     PHYSICAL EXAM:  Wt Readings from Last 3 Encounters:  06/16/19 149 lb 4.8 oz (67.7 kg)   Temp Readings from Last 3 Encounters:  06/16/19 99.1 F (37.3 C) (Oral)   BP Readings from Last 3 Encounters:  06/16/19 (!) 172/92   Pulse Readings from Last  3 Encounters:  06/16/19 86    In general this is a well appearing caucasian female in no acute distress. She's alert and oriented x4 and appropriate throughout the examination. Cardiopulmonary assessment is negative for acute distress and she exhibits normal effort. Breast exam is deferred.    ECOG = 1  0 - Asymptomatic (Fully active, able to carry on all predisease activities without restriction)  1 - Symptomatic but completely ambulatory (Restricted in physically strenuous activity but ambulatory and able to carry out work of a light or sedentary nature. For example, light housework, office work)  2 - Symptomatic, <50% in bed  during the day (Ambulatory and capable of all self care but unable to carry out any work activities. Up and about more than 50% of waking hours)  3 - Symptomatic, >50% in bed, but not bedbound (Capable of only limited self-care, confined to bed or chair 50% or more of waking hours)  4 - Bedbound (Completely disabled. Cannot carry on any self-care. Totally confined to bed or chair)  5 - Death   Eustace Pen MM, Creech RH, Tormey DC, et al. [phone]). "Toxicity and response criteria of the [facility]". [place] Oncol. 5 (6): 649-55    LABORATORY DATA:  Lab Results  Component Value Date   WBC 8.7 06/16/2019   HGB 12.5 06/16/2019   HCT 38.2 06/16/2019   MCV 87.6 06/16/2019   PLT 241 06/16/2019   Lab Results  Component Value Date   NA 140 06/16/2019   K 4.0 06/16/2019   CL 104 06/16/2019   CO2 26 06/16/2019   Lab Results  Component Value Date   ALT 14 06/16/2019   AST 15 06/16/2019   ALKPHOS 57 06/16/2019   BILITOT 0.8 06/16/2019      RADIOGRAPHY: No results found.     IMPRESSION/PLAN: 1. Stage IA, [id number] grade 3, ER/PR positive invasive ductal carcinoma of the right breast. Dr. Lisbeth Renshaw discusses the pathology findings and reviews the nature of invasive breast disease. The consensus from the breast conference includes breast conservation with lumpectomy with sentinel node biopsy. Depending on the size of the final tumor measurements rendered by pathology, the tumor may be tested for Oncotype Dx score to determine a role for systemic therapy. Provided that chemotherapy is not indicated, the patient's course would then be followed by external radiotherapy to the breast followed by antiestrogen therapy. We discussed the risks, benefits, short, and long term effects of radiotherapy, and the patient is interested in proceeding. Dr. Lisbeth Renshaw discusses the delivery and logistics of radiotherapy and anticipates a course of 4 weeks of radiotherapy based on what we know today.  She is aware that final pathology will determine this as well.  We will see her back about 2 weeks after surgery to discuss the simulation process and anticipate we starting radiotherapy about 4-6 weeks after surgery.   In a visit lasting 45 minutes, greater than 50% of the time was spent face to face discussing her case, and coordinating the patient's care.  The above documentation reflects my direct findings during this shared patient visit. Please see the separate note by Dr. Lisbeth Renshaw on this date for the remainder of the patient's plan of care.    Carola Rhine, PAC

## 2019-06-16 NOTE — Progress Notes (Signed)
REFERRING PROVIDER: Chauncey Cruel, MD [address] [place],  [place] [address]  PRIMARY PROVIDER:  Asencion Noble, MD  PRIMARY REASON FOR VISIT:  1. Malignant neoplasm of upper-inner quadrant of right breast in female, estrogen receptor positive ([place])   2. Family history of breast cancer   3. Family history of lung cancer     I connected with Donna Whitney on 06/16/2019 at 3:00 PM EDT by Webex and verified that I am speaking with the correct person using two identifiers.    Patient location: [facility] Provider location: clinic  HISTORY OF PRESENT ILLNESS:   Donna Whitney, a 63 y.o. female, was seen for a [place] cancer genetics consultation at the request of Dr. Jana Hakim due to a personal and family history of cancer.  Donna Whitney presents to clinic today to discuss the possibility of a hereditary predisposition to cancer, genetic testing, and to further clarify her future cancer risks, as well as potential cancer risks for family members.   In 2020, at the age of 57, Donna Whitney was diagnosed with IDC of the right breast, ER/PR+, Her2-. The treatment plan includes surgery, possible chemotherapy, adjuvant radiation and antiestrogens.     CANCER HISTORY:  Oncology History   No history exists.     RISK FACTORS:  Menarche was at age 61.  First live birth at age 51.  Ovaries intact: yes.  Hysterectomy: no.  Menopausal status: postmenopausal.  HRT use: 0 years. Colonoscopy: yes; normal. Up to date with pelvic exams: yes.  Past Medical History:  Diagnosis Date  . Family history of breast cancer   . Family history of lung cancer   . Hypertension   . IBS (irritable bowel syndrome)   . Ulcerative colitis [facility])     Past Surgical History:  Procedure Laterality Date  . CESAREAN SECTION    . TONSILLECTOMY     as a child  . TUBAL LIGATION      Social History   Socioeconomic History  . Marital status: Married    Spouse name: Not on file  . Number of children: 2  . Years of  education: Not on file  . Highest education level: Not on file  Occupational History  . Not on file  Social Needs  . Financial resource strain: Not on file  . Food insecurity    Worry: Not on file    Inability: Not on file  . Transportation needs    Medical: Not on file    Non-medical: Not on file  Tobacco Use  . Smoking status: Former Research scientist (life sciences)  . Smokeless tobacco: Never Used  Substance and Sexual Activity  . Alcohol use: Yes  . Drug use: Never  . Sexual activity: Not on file  Lifestyle  . Physical activity    Days per week: Not on file    Minutes per session: Not on file  . Stress: Not on file  Relationships  . Social Herbalist on phone: Not on file    Gets together: Not on file    Attends religious service: Not on file    Active member of club or organization: Not on file    Attends meetings of clubs or organizations: Not on file    Relationship status: Not on file  Other Topics Concern  . Not on file  Social History Narrative  . Not on file     FAMILY HISTORY:  We obtained a detailed, 4-generation family history.  Significant diagnoses are  listed below: Family History  Problem Relation Age of Onset  . Lung cancer Maternal Grandmother   . Breast cancer Paternal Aunt        dx 84 or older    Donna Whitney has a son, 73 and daughter, 80, no history of cancer. She has 2 sisters and 1 brother, all older than her, no cancers.   Donna Whitney mother died at 47 due to heart issues. Patient had 6 maternal aunts/uncles, no known cancers. No known cancers in maternal cousins. Maternal grandmother had lung cancer in her 85s. Maternal grandfather died at 33.  Donna Whitney father died at 21. Patient had 1 paternal uncle, 2 paternal aunts. One of her aunts had breast cancer at age 22 or older, she is not sure. No known cancers in paternal cousins. Her paternal grandmother died in her 51s, no information about paternal grandfather.   Donna Whitney is unaware of previous family  history of genetic testing for hereditary cancer risks. There is no reported Ashkenazi Jewish ancestry. There is no known consanguinity.  GENETIC COUNSELING ASSESSMENT: Donna Whitney is a 63 y.o. female with a personal and family history which is somewhat suggestive of a hereditary cancer syndrome and predisposition to cancer. We, therefore, discussed and recommended the following at today's visit.   DISCUSSION: We discussed that 5 - 10% of breast cancer is hereditary, with most cases associated with BRCA1/BRCA2 mutations.  There are other genes that can be associated with hereditary breast cancer syndromes.  These include PALB2, ATM, CHEK2.  We discussed that testing is beneficial for several reasons including surgical decision-making for breast cancer, knowing how to follow individuals after completing their treatment, and understand if other family members could be at risk for cancer and allow them to undergo genetic testing.   We reviewed the characteristics, features and inheritance patterns of hereditary cancer syndromes. We also discussed genetic testing, including the appropriate family members to test, the process of testing, insurance coverage and turn-around-time for results. We discussed the implications of a negative, positive and/or variant of uncertain significant result. In order to get genetic test results in a timely manner so that Donna Whitney can use these genetic test results for surgical decisions, we recommended Donna Whitney pursue genetic testing for the Breast Cancer STAT Panel. Once complete, we recommend Donna Whitney pursue reflex genetic testing to the Common Hereditary Cancers gene panel.   The STAT Breast cancer panel offered by [facility] includes sequencing and rearrangement analysis for the following 9 genes:  ATM, BRCA1, BRCA2, CDH1, CHEK2, PALB2, PTEN, STK11 and TP53.    The Common Hereditary Cancers Panel offered by [facility] includes sequencing and/or deletion duplication testing of the  following 48 genes: APC, ATM, AXIN2, BARD1, BMPR1A, BRCA1, BRCA2, BRIP1, CDH1, CDKN2A (p14ARF), CDKN2A (p16INK4a), CKD4, CHEK2, CTNNA1, DICER1, EPCAM (Deletion/duplication testing only), GREM1 (promoter region deletion/duplication testing only), KIT, MEN1, MLH1, MSH2, MSH3, MSH6, MUTYH, NBN, NF1, NHTL1, PALB2, PDGFRA, PMS2, POLD1, POLE, PTEN, RAD50, RAD51C, RAD51D, RNF43, SDHB, SDHC, SDHD, SMAD4, SMARCA4. STK11, TP53, TSC1, TSC2, and VHL.  The following genes were evaluated for sequence changes only: SDHA and HOXB13 c.251G>A variant only.  Based on Donna Whitney's personal and family history of cancer, she is on the edge of meeting criteria. If her paternal aunt was 73 or younger, she would meet, but if the paternal aunt was older then she does not. Since it is unclear if insurance would cover testing based on this, she has elected to pay [facility]'s patient pay price  of $250.   PLAN: After considering the risks, benefits, and limitations, Donna Whitney provided informed consent to pursue genetic testing and the blood sample was sent to [facility] for analysis of the Breast Cancer STAT Panel + Common Hereditary Cancers Panel. Initial results should be available within approximately 5-12 days' time, at which point they will be disclosed by telephone to Donna Whitney, as will any additional recommendations warranted by these results. Donna Whitney will receive a summary of her genetic counseling visit and a copy of her results once available. This information will also be available in Epic.   Lastly, we encouraged Donna Whitney to remain in contact with cancer genetics annually so that we can continuously update the family history and inform her of any changes in cancer genetics and testing that may be of benefit for this family.   Donna Whitney questions were answered to her satisfaction today. Our contact information was provided should additional questions or concerns arise. Thank you for the referral and allowing [place] to  share in the care of your patient.   Faith Rogue, MS, [place] Genetic Counselor [email or web address] Phone: [phone]  The patient was seen for a total of 15 minutes in virtual genetic counseling.  Drs. Magrinat, Lindi Adie and/or Burr Medico were available for discussion regarding this case.   _______________________________________________________________________ For Office Staff:  Number of people involved in session: 1 Was an Intern/ student involved with case: no

## 2019-06-17 ENCOUNTER — Telehealth: Payer: Self-pay | Admitting: Oncology

## 2019-06-17 NOTE — Telephone Encounter (Signed)
I left a message regarding schedule  

## 2019-06-21 ENCOUNTER — Telehealth: Payer: Self-pay | Admitting: *Deleted

## 2019-06-21 NOTE — Telephone Encounter (Signed)
Spoke to pt concerning [place] from 7.29.20. Denies questions or concerns regarding dx or treatment care plan. Discussed new appt with Mendel Ryder, NP will be made to accommodate for sx date and return of oncotype testing. Encourage pt to call with needs. Received verbal understanding.

## 2019-06-22 ENCOUNTER — Telehealth: Payer: Self-pay | Admitting: Adult Health

## 2019-06-22 NOTE — Telephone Encounter (Signed)
Called and r/s pt appt per 8/03 sch message - unable to reach pt . Left message with appt date and time

## 2019-06-23 ENCOUNTER — Encounter: Payer: Self-pay | Admitting: Licensed Clinical Social Worker

## 2019-06-23 ENCOUNTER — Telehealth: Payer: Self-pay

## 2019-06-23 ENCOUNTER — Ambulatory Visit: Payer: Self-pay | Admitting: Licensed Clinical Social Worker

## 2019-06-23 ENCOUNTER — Telehealth: Payer: Self-pay | Admitting: Licensed Clinical Social Worker

## 2019-06-23 DIAGNOSIS — C50211 Malignant neoplasm of upper-inner quadrant of right female breast: Secondary | ICD-10-CM

## 2019-06-23 DIAGNOSIS — Z1379 Encounter for other screening for genetic and chromosomal anomalies: Secondary | ICD-10-CM

## 2019-06-23 DIAGNOSIS — Z170 Estrogen receptor positive status [ER+]: Secondary | ICD-10-CM

## 2019-06-23 DIAGNOSIS — Z801 Family history of malignant neoplasm of trachea, bronchus and lung: Secondary | ICD-10-CM

## 2019-06-23 DIAGNOSIS — Z803 Family history of malignant neoplasm of breast: Secondary | ICD-10-CM

## 2019-06-23 NOTE — Telephone Encounter (Signed)
Nutrition Assessment  Reason for Assessment:  Pt attended Breast Clinic on 7/29 and nurse navigator provided nutrition packet  ASSESSMENT:   63 year old female with new diagnosis of breast cancer.  Planning surgery, oncotype, adjuvant radiation, antiestrogens.  Past medical history of IBS, HTN.    Spoke with patient via phone to introduce self and service at [facility].  Patient reports that she has to limit what she eats due to IBS.  Reports stress makes it worse and has an appointment to see GI this month.  Reports has been dealing with this for 30 years.   Medications:  reviewed  Labs: reviewed  Anthropometrics:   Height: 64 inches Weight: 149 lb 4.8 oz BMI: 25  Stable weight reported   NUTRITION DIAGNOSIS: Food and nutrition related knowledge deficit related to new diagnosis of breast cancer as evidenced by no prior need for nutrition related information.  INTERVENTION:   Discussed briefly packet of information regarding nutritional tips for breast cancer patients.  Patient with special restrictions due to IBS.   Contact information provided and patient knows to contact me with questions/concerns.    MONITORING, EVALUATION, and GOAL: Pt will consume a healthy diet to maintain lean body mass throughout treatment.   Roslynn Holte B. Zenia Resides, [place], [place] Registered Dietitian [phone] (pager)

## 2019-06-23 NOTE — Telephone Encounter (Signed)
Revealed negative genetic testing.  This normal result is reassuring and indicates that it is unlikely Donna Whitney's cancer is due to a hereditary cause.  It is unlikely that there is an increased risk of another cancer due to a mutation in one of these genes.  However, genetic testing is not perfect, and cannot definitively rule out a hereditary cause.  It will be important for her to keep in contact with genetics to learn if any additional testing may be needed in the future.      

## 2019-06-23 NOTE — Progress Notes (Signed)
HPI:  Donna Whitney was previously seen in the [place] clinic due to a personal and family history of breast cancer and concerns regarding a hereditary predisposition to cancer. Please refer to our prior cancer genetics clinic note for more information regarding our discussion, assessment and recommendations, at the time. Donna Whitney recent genetic test results were disclosed to her, as were recommendations warranted by these results. These results and recommendations are discussed in more detail below.  CANCER HISTORY:  Oncology History  Malignant neoplasm of upper-inner quadrant of right breast in female, estrogen receptor positive ([place])  06/14/2019 Initial Diagnosis   Malignant neoplasm of upper-inner quadrant of right breast in female, estrogen receptor positive ([place])   06/23/2019 Genetic Testing   Negative genetic testing. No pathogenic variants identified on the [facility] Common Hereditary Cancers Panel. The Common Hereditary Cancers Panel offered by [facility] includes sequencing and/or deletion duplication testing of the following 47 genes: APC, ATM, AXIN2, BARD1, BMPR1A, BRCA1, BRCA2, BRIP1, CDH1, CDKN2A (p14ARF), CDKN2A (p16INK4a), CKD4, CHEK2, CTNNA1, DICER1, EPCAM (Deletion/duplication testing only), GREM1 (promoter region deletion/duplication testing only), KIT, MEN1, MLH1, MSH2, MSH3, MSH6, MUTYH, NBN, NF1, NHTL1, PALB2, PDGFRA, PMS2, POLD1, POLE, PTEN, RAD50, RAD51C, RAD51D, SDHB, SDHC, SDHD, SMAD4, SMARCA4. STK11, TP53, TSC1, TSC2, and VHL.  The following genes were evaluated for sequence changes only: SDHA and HOXB13 c.251G>A variant only. The report date is 06/23/2019.     FAMILY HISTORY:  We obtained a detailed, 4-generation family history.  Significant diagnoses are listed below: Family History  Problem Relation Age of Onset   Lung cancer Maternal Grandmother    Breast cancer Paternal Aunt        dx 89 or older   Donna Whitney has a son, 65 and daughter, 35, no history of  cancer. She has 2 sisters and 1 brother, all older than her, no cancers.   Donna Whitney mother died at 25 due to heart issues. Patient had 6 maternal aunts/uncles, no known cancers. No known cancers in maternal cousins. Maternal grandmother had lung cancer in her 54s. Maternal grandfather died at 17.  Donna Whitney father died at 24. Patient had 1 paternal uncle, 2 paternal aunts. One of her aunts had breast cancer at age 73 or older, she is not sure. No known cancers in paternal cousins. Her paternal grandmother died in her 74s, no information about paternal grandfather.   Donna Whitney is unaware of previous family history of genetic testing for hereditary cancer risks. There is no reported Ashkenazi Jewish ancestry. There is no known consanguinity.  GENETIC TEST RESULTS: Genetic testing reported out on 06/23/2019 through the [facility] Breast Cancer STAT Panel + Common Hereditary cancer panel found no pathogenic mutations. The STAT Breast cancer panel offered by [facility] includes sequencing and rearrangement analysis for the following 9 genes:  ATM, BRCA1, BRCA2, CDH1, CHEK2, PALB2, PTEN, STK11 and TP53.  The Common Hereditary Cancers Panel offered by [facility] includes sequencing and/or deletion duplication testing of the following 48 genes: APC, ATM, AXIN2, BARD1, BMPR1A, BRCA1, BRCA2, BRIP1, CDH1, CDKN2A (p14ARF), CDKN2A (p16INK4a), CKD4, CHEK2, CTNNA1, DICER1, EPCAM (Deletion/duplication testing only), GREM1 (promoter region deletion/duplication testing only), KIT, MEN1, MLH1, MSH2, MSH3, MSH6, MUTYH, NBN, NF1, NHTL1, PALB2, PDGFRA, PMS2, POLD1, POLE, PTEN, RAD50, RAD51C, RAD51D, RNF43, SDHB, SDHC, SDHD, SMAD4, SMARCA4. STK11, TP53, TSC1, TSC2, and VHL.  The following genes were evaluated for sequence changes only: SDHA and HOXB13 c.251G>A variant only. The test report has been scanned into EPIC and is located under the Molecular  Pathology section of the Results Review tab.  A portion of the result report is  included below for reference.     We discussed with Donna Whitney that because current genetic testing is not perfect, it is possible there may be a gene mutation in one of these genes that current testing cannot detect, but that chance is small.  We also discussed, that there could be another gene that has not yet been discovered, or that we have not yet tested, that is responsible for the cancer diagnoses in the family. It is also possible there is a hereditary cause for the cancer in the family that Donna Whitney did not inherit and therefore was not identified in her testing.  Therefore, it is important to remain in touch with cancer genetics in the future so that we can continue to offer Donna Whitney the most up to date genetic testing.   ADDITIONAL GENETIC TESTING: We discussed with Donna Whitney that her genetic testing was fairly extensive.  If there are genes identified to increase cancer risk that can be analyzed in the future, we would be happy to discuss and coordinate this testing at that time.    CANCER SCREENING RECOMMENDATIONS: Donna Whitney test result is considered negative (normal).  This means that we have not identified a hereditary cause for her  personal and family history of cancer at this time. Most cancers happen by chance and this negative test suggests that her cancer may fall into this category.    While reassuring, this does not definitively rule out a hereditary predisposition to cancer. It is still possible that there could be genetic mutations that are undetectable by current technology. There could be genetic mutations in genes that have not been tested or identified to increase cancer risk.  Therefore, it is recommended she continue to follow the cancer management and screening guidelines provided by her oncology and primary healthcare provider.   An individual's cancer risk and medical management are not determined by genetic test results alone. Overall cancer risk assessment  incorporates additional factors, including personal medical history, family history, and any available genetic information that may result in a personalized plan for cancer prevention and surveillance.  RECOMMENDATIONS FOR FAMILY MEMBERS:  Relatives in this family might be at some increased risk of developing cancer, over the general population risk, simply due to the family history of cancer.  We recommended female relatives in this family have a yearly mammogram beginning at age 78, or 48 years younger than the earliest onset of cancer, an annual clinical breast exam, and perform monthly breast self-exams. Female relatives in this family should also have a gynecological exam as recommended by their primary provider. All family members should have a colonoscopy by age 73, or as directed by their physicians.  FOLLOW-UP: Lastly, we discussed with Donna Whitney that cancer genetics is a rapidly advancing field and it is possible that new genetic tests will be appropriate for her and/or her family members in the future. We encouraged her to remain in contact with cancer genetics on an annual basis so we can update her personal and family histories and let her know of advances in cancer genetics that may benefit this family.   Our contact number was provided. Donna Whitney questions were answered to her satisfaction, and she knows she is welcome to call us at anytime with additional questions or concerns.   Faith Rogue, MS, [facility] Genetic Counselor [email or web address] Phone: [phone]

## 2019-06-29 ENCOUNTER — Encounter: Payer: Self-pay | Admitting: General Practice

## 2019-06-29 NOTE — Progress Notes (Signed)
[place] Psychosocial Distress Screening Spiritual Care  Met with Thayer Headings by phone following Breast Multidisciplinary Clinic to introduce [place] team/resources, reviewing distress screen per protocol.  The patient scored a 10 on the Psychosocial Distress Thermometer which indicates severe distress. Also assessed for distress and other psychosocial needs.   ONCBCN DISTRESS SCREENING 06/29/2019  Screening Type Initial Screening  Distress experienced in past week (1-10) 10  Emotional problem type Nervousness/Anxiety;Adjusting to illness  Physical Problem type Constipation/diarrhea  Referral to support programs Yes    Per Ms Brozowski, she is feeling much more settled and less anxious now that she has been through [facility] and had some time to digest. She reports good support from husband, children, close friends, and church, including many people praying for her; she anticipates that the support and prayer are what will "get [her] through" her cancer experience.   Follow up needed: No. Per pt, no needs or concerns at this time, but she is aware of ongoing [facility] team/programming availability as needed/desired. Please also page if immediate needs arise or circumstances change. Thank you!   [place], [place], [facility] Pager [phone] Voicemail [phone]

## 2019-07-12 ENCOUNTER — Encounter (HOSPITAL_BASED_OUTPATIENT_CLINIC_OR_DEPARTMENT_OTHER): Payer: Self-pay | Admitting: *Deleted

## 2019-07-12 ENCOUNTER — Other Ambulatory Visit: Payer: Self-pay

## 2019-07-13 ENCOUNTER — Telehealth: Payer: Self-pay | Admitting: *Deleted

## 2019-07-13 NOTE — Telephone Encounter (Signed)
Pt called with concerns regarding seed placement and insurance coverage. Pt's insurance will move to Medicare on 9/1, same day as sx. Seed is scheduled for 8/31, not covered under current insurance. Spoke with [employer], new seed placement date and time will be on 9/1 at 7:30. Left vm for pt with this information.

## 2019-07-16 ENCOUNTER — Other Ambulatory Visit (HOSPITAL_COMMUNITY)
Admission: RE | Admit: 2019-07-16 | Discharge: 2019-07-16 | Disposition: A | Discharge status: Home / Self Care | Payer: BLUE CROSS/BLUE SHIELD | Source: Ambulatory Visit | Attending: Surgery | Admitting: Surgery

## 2019-07-16 ENCOUNTER — Other Ambulatory Visit: Payer: Self-pay

## 2019-07-16 ENCOUNTER — Encounter (HOSPITAL_BASED_OUTPATIENT_CLINIC_OR_DEPARTMENT_OTHER)
Admission: RE | Admit: 2019-07-16 | Discharge: 2019-07-16 | Disposition: A | Discharge status: Home / Self Care | Payer: BLUE CROSS/BLUE SHIELD | Source: Ambulatory Visit | Attending: Surgery | Admitting: Surgery

## 2019-07-16 DIAGNOSIS — Z01818 Encounter for other preprocedural examination: Secondary | ICD-10-CM | POA: Insufficient documentation

## 2019-07-16 DIAGNOSIS — Z20828 Contact with and (suspected) exposure to other viral communicable diseases: Secondary | ICD-10-CM | POA: Diagnosis not present

## 2019-07-16 LAB — CBC WITH DIFFERENTIAL/PLATELET
Abs Immature Granulocytes: 0.01 10*3/uL (ref 0.00–0.07)
Basophils Absolute: 0.1 10*3/uL (ref 0.0–0.1)
Basophils Relative: 1 %
Eosinophils Absolute: 0.1 10*3/uL (ref 0.0–0.5)
Eosinophils Relative: 2 %
HCT: 39.6 % (ref 36.0–46.0)
Hemoglobin: 13.3 g/dL (ref 12.0–15.0)
Immature Granulocytes: 0 %
Lymphocytes Relative: 37 %
Lymphs Abs: 3 10*3/uL (ref 0.7–4.0)
MCH: 29.5 pg (ref 26.0–34.0)
MCHC: 33.6 g/dL (ref 30.0–36.0)
MCV: 87.8 fL (ref 80.0–100.0)
Monocytes Absolute: 0.7 10*3/uL (ref 0.1–1.0)
Monocytes Relative: 9 %
Neutro Abs: 4.1 10*3/uL (ref 1.7–7.7)
Neutrophils Relative %: 51 %
Platelets: 235 10*3/uL (ref 150–400)
RBC: 4.51 MIL/uL (ref 3.87–5.11)
RDW: 13.2 % (ref 11.5–15.5)
WBC: 8 10*3/uL (ref 4.0–10.5)
nRBC: 0 % (ref 0.0–0.2)

## 2019-07-16 LAB — COMPREHENSIVE METABOLIC PANEL
ALT: 14 U/L (ref 0–44)
AST: 15 U/L (ref 15–41)
Albumin: 4.5 g/dL (ref 3.5–5.0)
Alkaline Phosphatase: 54 U/L (ref 38–126)
Anion gap: 10 (ref 5–15)
BUN: 8 mg/dL (ref 8–23)
CO2: 25 mmol/L (ref 22–32)
Calcium: 9.7 mg/dL (ref 8.9–10.3)
Chloride: 103 mmol/L (ref 98–111)
Creatinine, Ser: 0.76 mg/dL (ref 0.44–1.00)
GFR calc Af Amer: >60 mL/min (ref >60)
GFR calc non Af Amer: >60 mL/min (ref >60)
Glucose, Bld: 96 mg/dL (ref 70–99)
Potassium: 4.3 mmol/L (ref 3.5–5.1)
Sodium: 138 mmol/L (ref 135–145)
Total Bilirubin: 0.8 mg/dL (ref 0.3–1.2)
Total Protein: 7.1 g/dL (ref 6.5–8.1)

## 2019-07-16 LAB — SARS CORONAVIRUS 2 (TAT 6-24 HRS): SARS Coronavirus 2: NEGATIVE

## 2019-07-16 NOTE — Progress Notes (Signed)
EKG reviewed by Dr. Lissa Hoard, will proceed with surgery as scheduled.

## 2019-07-16 NOTE — Progress Notes (Signed)

## 2019-07-20 ENCOUNTER — Ambulatory Visit (HOSPITAL_BASED_OUTPATIENT_CLINIC_OR_DEPARTMENT_OTHER)
Admission: RE | Admit: 2019-07-20 | Discharge: 2019-07-20 | Disposition: A | Discharge status: Home / Self Care | Payer: Medicare Other | Attending: Surgery | Admitting: Surgery

## 2019-07-20 ENCOUNTER — Encounter (HOSPITAL_BASED_OUTPATIENT_CLINIC_OR_DEPARTMENT_OTHER)
Admission: RE | Disposition: A | Discharge status: Home / Self Care | Payer: Self-pay | Source: Home / Self Care | Attending: Surgery

## 2019-07-20 ENCOUNTER — Encounter (HOSPITAL_BASED_OUTPATIENT_CLINIC_OR_DEPARTMENT_OTHER): Payer: Self-pay

## 2019-07-20 ENCOUNTER — Ambulatory Visit (HOSPITAL_BASED_OUTPATIENT_CLINIC_OR_DEPARTMENT_OTHER): Payer: Medicare Other | Admitting: Certified Registered"

## 2019-07-20 ENCOUNTER — Other Ambulatory Visit: Payer: Self-pay

## 2019-07-20 ENCOUNTER — Encounter (HOSPITAL_COMMUNITY)
Admission: RE | Admit: 2019-07-20 | Discharge: 2019-07-20 | Disposition: A | Discharge status: Home / Self Care | Payer: Medicare Other | Source: Ambulatory Visit | Attending: Surgery | Admitting: Surgery

## 2019-07-20 DIAGNOSIS — C50911 Malignant neoplasm of unspecified site of right female breast: Secondary | ICD-10-CM | POA: Diagnosis present

## 2019-07-20 DIAGNOSIS — K219 Gastro-esophageal reflux disease without esophagitis: Secondary | ICD-10-CM | POA: Insufficient documentation

## 2019-07-20 DIAGNOSIS — Z170 Estrogen receptor positive status [ER+]: Secondary | ICD-10-CM

## 2019-07-20 DIAGNOSIS — Z87891 Personal history of nicotine dependence: Secondary | ICD-10-CM | POA: Diagnosis not present

## 2019-07-20 DIAGNOSIS — I10 Essential (primary) hypertension: Secondary | ICD-10-CM | POA: Insufficient documentation

## 2019-07-20 HISTORY — DX: Other complications of anesthesia, initial encounter: T88.59XA

## 2019-07-20 HISTORY — DX: Other specified postprocedural states: Z98.890

## 2019-07-20 HISTORY — PX: BREAST LUMPECTOMY WITH RADIOACTIVE SEED AND SENTINEL LYMPH NODE BIOPSY: SHX6550

## 2019-07-20 HISTORY — DX: Gastro-esophageal reflux disease without esophagitis: K21.9

## 2019-07-20 HISTORY — DX: Anxiety disorder, unspecified: F41.9

## 2019-07-20 HISTORY — DX: Nausea with vomiting, unspecified: R11.2

## 2019-07-20 SURGERY — BREAST LUMPECTOMY WITH RADIOACTIVE SEED AND SENTINEL LYMPH NODE BIOPSY
Anesthesia: General | Site: Breast | Laterality: Right

## 2019-07-20 MED ORDER — CEFAZOLIN SODIUM-DEXTROSE 2-4 GM/100ML-% IV SOLN
INTRAVENOUS | Status: AC
  Filled 2019-07-20: qty 100

## 2019-07-20 MED ORDER — MIDAZOLAM HCL 2 MG/2ML IJ SOLN
1.0000 mg | INTRAMUSCULAR | Status: DC | PRN
  Administered 2019-07-20: 2 mg via INTRAVENOUS

## 2019-07-20 MED ORDER — LACTATED RINGERS IV SOLN
INTRAVENOUS | Status: DC
  Administered 2019-07-20 (×2): via INTRAVENOUS

## 2019-07-20 MED ORDER — FENTANYL CITRATE (PF) 100 MCG/2ML IJ SOLN
50.0000 ug | INTRAMUSCULAR | Status: DC | PRN
  Administered 2019-07-20: 100 ug via INTRAVENOUS
  Administered 2019-07-20: 25 ug via INTRAVENOUS

## 2019-07-20 MED ORDER — GABAPENTIN 300 MG PO CAPS
ORAL_CAPSULE | ORAL | Status: AC
  Filled 2019-07-20: qty 1

## 2019-07-20 MED ORDER — ACETAMINOPHEN 500 MG PO TABS
1000.0000 mg | ORAL_TABLET | ORAL | Status: AC
  Administered 2019-07-20: 1000 mg via ORAL

## 2019-07-20 MED ORDER — DEXTROSE 5 % IV SOLN
3.0000 g | INTRAVENOUS | Status: AC
  Administered 2019-07-20: 13:00:00 2 g via INTRAVENOUS

## 2019-07-20 MED ORDER — SCOPOLAMINE 1 MG/3DAYS TD PT72: 1.0000 | MEDICATED_PATCH | Freq: Once | TRANSDERMAL | Status: DC

## 2019-07-20 MED ORDER — CHLORHEXIDINE GLUCONATE CLOTH 2 % EX PADS: 6.0000 | MEDICATED_PAD | Freq: Once | CUTANEOUS | Status: DC

## 2019-07-20 MED ORDER — PROPOFOL 10 MG/ML IV BOLUS
INTRAVENOUS | Status: DC | PRN
  Administered 2019-07-20: 160 mg via INTRAVENOUS

## 2019-07-20 MED ORDER — PHENYLEPHRINE HCL (PRESSORS) 10 MG/ML IV SOLN
INTRAVENOUS | Status: DC | PRN
  Administered 2019-07-20: 40 ug via INTRAVENOUS
  Administered 2019-07-20: 80 ug via INTRAVENOUS
  Administered 2019-07-20: 40 ug via INTRAVENOUS

## 2019-07-20 MED ORDER — IBUPROFEN 800 MG PO TABS: 800.0000 mg | ORAL_TABLET | Freq: Three times a day (TID) | ORAL | 0 refills | Status: DC | PRN

## 2019-07-20 MED ORDER — ONDANSETRON HCL 4 MG/2ML IJ SOLN
INTRAMUSCULAR | Status: DC | PRN
  Administered 2019-07-20: 4 mg via INTRAVENOUS

## 2019-07-20 MED ORDER — OXYCODONE HCL 5 MG PO TABS
5.0000 mg | ORAL_TABLET | Freq: Once | ORAL | Status: AC
  Administered 2019-07-20: 5 mg via ORAL

## 2019-07-20 MED ORDER — ACETAMINOPHEN 500 MG PO TABS
ORAL_TABLET | ORAL | Status: AC
  Filled 2019-07-20: qty 2

## 2019-07-20 MED ORDER — BUPIVACAINE-EPINEPHRINE (PF) 0.25% -1:200000 IJ SOLN
INTRAMUSCULAR | Status: DC | PRN
  Administered 2019-07-20: 12 mL

## 2019-07-20 MED ORDER — EPHEDRINE SULFATE 50 MG/ML IJ SOLN
INTRAMUSCULAR | Status: DC | PRN
  Administered 2019-07-20 (×3): 10 mg via INTRAVENOUS
  Administered 2019-07-20: 5 mg via INTRAVENOUS

## 2019-07-20 MED ORDER — DEXAMETHASONE SODIUM PHOSPHATE 10 MG/ML IJ SOLN
INTRAMUSCULAR | Status: DC | PRN
  Administered 2019-07-20: 4 mg via INTRAVENOUS

## 2019-07-20 MED ORDER — MIDAZOLAM HCL 2 MG/2ML IJ SOLN
INTRAMUSCULAR | Status: AC
  Filled 2019-07-20: qty 2

## 2019-07-20 MED ORDER — OXYCODONE HCL 5 MG PO TABS
ORAL_TABLET | ORAL | Status: AC
  Filled 2019-07-20: qty 1

## 2019-07-20 MED ORDER — TECHNETIUM TC 99M SULFUR COLLOID FILTERED
1.0000 | Freq: Once | INTRAVENOUS | Status: AC | PRN
  Administered 2019-07-20: 1 via INTRADERMAL

## 2019-07-20 MED ORDER — FENTANYL CITRATE (PF) 100 MCG/2ML IJ SOLN
INTRAMUSCULAR | Status: AC
  Filled 2019-07-20: qty 2

## 2019-07-20 MED ORDER — OXYCODONE HCL 5 MG PO TABS: 5.0000 mg | ORAL_TABLET | Freq: Four times a day (QID) | ORAL | 0 refills | Status: DC | PRN

## 2019-07-20 MED ORDER — ROPIVACAINE HCL 5 MG/ML IJ SOLN
INTRAMUSCULAR | Status: DC | PRN
  Administered 2019-07-20: 30 mL

## 2019-07-20 MED ORDER — GABAPENTIN 300 MG PO CAPS
300.0000 mg | ORAL_CAPSULE | ORAL | Status: AC
  Administered 2019-07-20: 300 mg via ORAL

## 2019-07-20 MED ORDER — PROPOFOL 10 MG/ML IV BOLUS
INTRAVENOUS | Status: AC
  Filled 2019-07-20: qty 20

## 2019-07-20 SURGICAL SUPPLY — 53 items
ADH SKN CLS APL DERMABOND .7 (GAUZE/BANDAGES/DRESSINGS) ×1
APL PRP STRL LF DISP 70% ISPRP (MISCELLANEOUS) ×1
APPLIER CLIP 9.375 MED OPEN (MISCELLANEOUS) ×2
APR CLP MED 9.3 20 MLT OPN (MISCELLANEOUS) ×1
BINDER BREAST LRG (GAUZE/BANDAGES/DRESSINGS) ×1 IMPLANT
BINDER BREAST MEDIUM (GAUZE/BANDAGES/DRESSINGS) IMPLANT
BLADE SURG 15 STRL LF DISP TIS (BLADE) ×1 IMPLANT
BLADE SURG 15 STRL SS (BLADE) ×2
CANISTER SUC SOCK COL 7IN (MISCELLANEOUS) IMPLANT
CANISTER SUCT 1200ML W/VALVE (MISCELLANEOUS) ×2 IMPLANT
CHLORAPREP W/TINT 26 (MISCELLANEOUS) ×2 IMPLANT
CLIP APPLIE 9.375 MED OPEN (MISCELLANEOUS) ×1 IMPLANT
COVER BACK TABLE REUSABLE LG (DRAPES) ×2 IMPLANT
COVER MAYO STAND REUSABLE (DRAPES) ×2 IMPLANT
COVER PROBE W GEL 5X96 (DRAPES) ×2 IMPLANT
DECANTER SPIKE VIAL GLASS SM (MISCELLANEOUS) ×1 IMPLANT
DERMABOND ADVANCED (GAUZE/BANDAGES/DRESSINGS) ×1
DERMABOND ADVANCED .7 DNX12 (GAUZE/BANDAGES/DRESSINGS) ×1 IMPLANT
DRAPE LAPAROSCOPIC ABDOMINAL (DRAPES) ×2 IMPLANT
DRAPE UTILITY XL STRL (DRAPES) ×2 IMPLANT
ELECT COATED BLADE 2.86 ST (ELECTRODE) ×2 IMPLANT
ELECT REM PT RETURN 9FT ADLT (ELECTROSURGICAL) ×2
ELECTRODE REM PT RTRN 9FT ADLT (ELECTROSURGICAL) ×1 IMPLANT
GLOVE BIO SURGEON STRL SZ 6.5 (GLOVE) ×1 IMPLANT
GLOVE BIOGEL PI IND STRL 7.0 (GLOVE) ×1 IMPLANT
GLOVE BIOGEL PI IND STRL 7.5 (GLOVE) IMPLANT
GLOVE BIOGEL PI IND STRL 8 (GLOVE) ×1 IMPLANT
GLOVE BIOGEL PI INDICATOR 7.0 (GLOVE) ×1
GLOVE BIOGEL PI INDICATOR 7.5 (GLOVE) ×1
GLOVE BIOGEL PI INDICATOR 8 (GLOVE) ×1
GLOVE ECLIPSE 8.0 STRL XLNG CF (GLOVE) ×2 IMPLANT
GLOVE SURG SS PI 7.0 STRL IVOR (GLOVE) ×2 IMPLANT
GOWN STRL REUS W/ TWL LRG LVL3 (GOWN DISPOSABLE) ×2 IMPLANT
GOWN STRL REUS W/TWL LRG LVL3 (GOWN DISPOSABLE) ×6
HEMOSTAT ARISTA ABSORB 3G PWDR (HEMOSTASIS) IMPLANT
HEMOSTAT SNOW SURGICEL 2X4 (HEMOSTASIS) IMPLANT
KIT MARKER MARGIN INK (KITS) ×2 IMPLANT
NDL HYPO 25X1 1.5 SAFETY (NEEDLE) ×1 IMPLANT
NDL SAFETY ECLIPSE 18X1.5 (NEEDLE) IMPLANT
NEEDLE HYPO 18GX1.5 SHARP (NEEDLE)
NEEDLE HYPO 25X1 1.5 SAFETY (NEEDLE) ×2 IMPLANT
NS IRRIG 1000ML POUR BTL (IV SOLUTION) ×2 IMPLANT
PACK BASIN DAY SURGERY FS (CUSTOM PROCEDURE TRAY) ×2 IMPLANT
PENCIL BUTTON HOLSTER BLD 10FT (ELECTRODE) ×2 IMPLANT
SLEEVE SCD COMPRESS KNEE MED (MISCELLANEOUS) ×2 IMPLANT
SPONGE LAP 4X18 RFD (DISPOSABLE) ×2 IMPLANT
SUT MNCRL AB 4-0 PS2 18 (SUTURE) ×2 IMPLANT
SUT VICRYL 3-0 CR8 SH (SUTURE) ×2 IMPLANT
SYR CONTROL 10ML LL (SYRINGE) ×2 IMPLANT
TOWEL GREEN STERILE FF (TOWEL DISPOSABLE) ×2 IMPLANT
TRAY FAXITRON CT DISP (TRAY / TRAY PROCEDURE) ×2 IMPLANT
TUBE CONNECTING 20X1/4 (TUBING) ×2 IMPLANT
YANKAUER SUCT BULB TIP NO VENT (SUCTIONS) ×2 IMPLANT

## 2019-07-20 NOTE — Discharge Instructions (Signed)
[facility] °Office Phone Number [phone] ° °BREAST BIOPSY/ PARTIAL MASTECTOMY: POST OP INSTRUCTIONS ° °Always review your discharge instruction sheet given to you by the facility where your surgery was performed. ° °IF YOU HAVE DISABILITY OR FAMILY LEAVE FORMS, YOU MUST BRING THEM TO THE OFFICE FOR PROCESSING.  DO NOT GIVE THEM TO YOUR DOCTOR. ° °1. A prescription for pain medication may be given to you upon discharge.  Take your pain medication as prescribed, if needed.  If narcotic pain medicine is not needed, then you may take acetaminophen (Tylenol) or ibuprofen (Advil) as needed. °2. Take your usually prescribed medications unless otherwise directed °3. If you need a refill on your pain medication, please contact your pharmacy.  They will contact our office to request authorization.  Prescriptions will not be filled after 5pm or on week-ends. °4. You should eat very light the first 24 hours after surgery, such as soup, crackers, pudding, etc.  Resume your normal diet the day after surgery. °5. Most patients will experience some swelling and bruising in the breast.  Ice packs and a good support bra will help.  Swelling and bruising can take several days to resolve.  °6. It is common to experience some constipation if taking pain medication after surgery.  Increasing fluid intake and taking a stool softener will usually help or prevent this problem from occurring.  A mild laxative (Milk of Magnesia or Miralax) should be taken according to package directions if there are no bowel movements after 48 hours. °7. Unless discharge instructions indicate otherwise, you may remove your bandages 24-48 hours after surgery, and you may shower at that time.  You may have steri-strips (small skin tapes) in place directly over the incision.  These strips should be left on the skin for 7-10 days.  If your surgeon used skin glue on the incision, you may shower in 24 hours.  The glue will flake off over the  next 2-3 weeks.  Any sutures or staples will be removed at the office during your follow-up visit. °8. ACTIVITIES:  You may resume regular daily activities (gradually increasing) beginning the next day.  Wearing a good support bra or sports bra minimizes pain and swelling.  You may have sexual intercourse when it is comfortable. °a. You may drive when you no longer are taking prescription pain medication, you can comfortably wear a seatbelt, and you can safely maneuver your car and apply brakes. °b. RETURN TO WORK:  ______________________________________________________________________________________ °9. You should see your doctor in the office for a follow-up appointment approximately two weeks after your surgery.  Your doctor’s nurse will typically make your follow-up appointment when she calls you with your pathology report.  Expect your pathology report 2-3 business days after your surgery.  You may call to check if you do not hear from us after three days. °10. OTHER INSTRUCTIONS: _______________________________________________________________________________________________ _____________________________________________________________________________________________________________________________________ °_____________________________________________________________________________________________________________________________________ °_____________________________________________________________________________________________________________________________________ ° °WHEN TO CALL YOUR DOCTOR: °1. Fever over 101.0 °2. Nausea and/or vomiting. °3. Extreme swelling or bruising. °4. Continued bleeding from incision. °5. Increased pain, redness, or drainage from the incision. ° °The clinic staff is available to answer your questions during regular business hours.  Please don’t hesitate to call and ask to speak to one of the nurses for clinical concerns.  If you have a medical emergency, go to the nearest  emergency room or call 911.  A surgeon from [facility] is always on call at the hospital. ° °For further questions, please visit [email or web address]  ° ° ° ° °  Post Anesthesia Home Care Instructions ° °Activity: °Get plenty of rest for the remainder of the day. A responsible individual must stay with you for 24 hours following the procedure.  °For the next 24 hours, DO NOT: °-Drive a car °-Operate machinery °-Drink alcoholic beverages °-Take any medication unless instructed by your physician °-Make any legal decisions or sign important papers. ° °Meals: °Start with liquid foods such as gelatin or soup. Progress to regular foods as tolerated. Avoid greasy, spicy, heavy foods. If nausea and/or vomiting occur, drink only clear liquids until the nausea and/or vomiting subsides. Call your physician if vomiting continues. ° °Special Instructions/Symptoms: °Your throat may feel dry or sore from the anesthesia or the breathing tube placed in your throat during surgery. If this causes discomfort, gargle with warm salt water. The discomfort should disappear within 24 hours. ° °If you had a scopolamine patch placed behind your ear for the management of post- operative nausea and/or vomiting: ° °1. The medication in the patch is effective for 72 hours, after which it should be removed.  Wrap patch in a tissue and discard in the trash. Wash hands thoroughly with soap and water. °2. You may remove the patch earlier than 72 hours if you experience unpleasant side effects which may include dry mouth, dizziness or visual disturbances. °3. Avoid touching the patch. Wash your hands with soap and water after contact with the patch. °  ° °

## 2019-07-20 NOTE — Op Note (Signed)
Preoperative diagnosis: Stage I right breast cancer  Postoperative diagnosis: Same  Procedure: Right breast seed localized lumpectomy with right axillary sentinel lymph node mapping of deep right axillary node x3  Surgeon: Erroll Luna, MD  Anesthesia: General with pectoral block and local  EBL: 30 cc  Drains: None  IV fluids: Per anesthesia record  Specimens: Right breast lumpectomy with seed and clip verified by Faxitron and 3 right axillary sentinel nodes hot  Indications for procedure: The patient is a 63 year old female with stage I right breast cancer.  This was a centrally located lesion and she opted for lumpectomy and breast conservation surgery after being seen by both surgery, medical and radiation oncology.  Our options are discussed as well as mastectomy with reconstruction the pros and cons of that versus breast conserving surgery with adjuvant therapy.  Lumpectomy was discussed.  Risk of bleeding, infection, seroma, cosmetic deformity, nipple loss and/or dysfunction and/or cosmetic deformity, lymphedema, shoulder stiffness and numbness along the inner aspect the right arm as well as other more rare complications of bleeding, infection, death, blood clot, stroke.  She understood the above and wished to proceed with breast conserving surgery.  Description of procedure: The patient was met in the holding area.  Neoprobe was used to verify seed location.  She had a pectoral block placed anesthesia and technetium sulfur colloid was injected to the nipple.  She was taken back the operating.  She is placed supine upon the OR table.  After induction of general esthesia the right breast was prepped and draped in a sterile fashion.  Timeout was done to verify proper patient and procedure.  Neoprobe was used to identify the seed which was underneath the right nipple.  Curvilinear incision was made along the superior aspect of the nipple areolar complex.  Dissection was carried down and all  tissue around the seed and clip were excised with grossly negative margins.  Faxitron image was placed in epic for the radiologist and both seed and clip were verified in the specimen.  After hemostasis achieved with cautery this was closed with 3-0 Vicryl and 4-0 Monocryl.  Neoprobe was used in hospital identified in the right axilla.  Incision was made in her axilla dissection was carried down into the deep level 1 axillary lymph node basin.  There were 3 hot nodes identified and removed.  Background counts approaches 0.  The long thoracic nerve, thoracodorsal trunk and axillary vein were all preserved.  After ensuring hemostasis as we was closed with 3-0 Vicryl and 4-0 Monocryl.  Both incisions were infiltrated with local anesthetic.  Dermabond applied.  All counts were found to be correct.  The patient was awoke extubated taken recovery in satisfactory condition.

## 2019-07-20 NOTE — Progress Notes (Signed)
Assisted Dr. Carignan with right, ultrasound guided, pectoralis block. Side rails up, monitors on throughout procedure. See vital signs in flow sheet. Tolerated Procedure well. 

## 2019-07-20 NOTE — Transfer of Care (Signed)
Immediate Anesthesia Transfer of Care Note  Patient: Donna Whitney  Procedure(s) Performed: RIGHT BREAST LUMPECTOMY WITH RADIOACTIVE SEED AND RIGHT SENTINEL LYMPH NODE MAPPING (Right Breast)  Patient Location: PACU  Anesthesia Type:General and Regional  Level of Consciousness: drowsy  Airway & Oxygen Therapy: Patient Spontanous Breathing and Patient connected to face mask oxygen  Post-op Assessment: Report given to RN and Post -op Vital signs reviewed and stable  Post vital signs: Reviewed and stable  Last Vitals:  Vitals Value Taken Time  BP    Temp    Pulse    Resp    SpO2      Last Pain:  Vitals:   07/20/19 0922  TempSrc: Oral  PainSc: 0-No pain         Complications: No apparent anesthesia complications

## 2019-07-20 NOTE — Anesthesia Procedure Notes (Signed)
Procedure Name: LMA Insertion Date/Time: 07/20/2019 12:50 PM Performed by: Lavonia Dana, CRNA Pre-anesthesia Checklist: Patient identified, Emergency Drugs available, Suction available and Patient being monitored Patient Re-evaluated:Patient Re-evaluated prior to induction Oxygen Delivery Method: Circle system utilized Preoxygenation: Pre-oxygenation with 100% oxygen Induction Type: IV induction Ventilation: Mask ventilation without difficulty LMA: LMA inserted LMA Size: 4.0 Number of attempts: 1 Airway Equipment and Method: Bite block Placement Confirmation: positive ETCO2 Tube secured with: Tape Dental Injury: Teeth and Oropharynx as per pre-operative assessment

## 2019-07-20 NOTE — Progress Notes (Signed)
Nuc med injection done by nuc med staff.  VS stable. No additional sedation required. Patient tolerated well.

## 2019-07-20 NOTE — Interval H&P Note (Signed)
History and Physical Interval Note:  07/20/2019 12:11 PM  Donna Whitney  has presented today for surgery, with the diagnosis of RIGHT BREAST CANCER.  The various methods of treatment have been discussed with the patient and family. After consideration of risks, benefits and other options for treatment, the patient has consented to  Procedure(s) with comments: RIGHT BREAST LUMPECTOMY WITH RADIOACTIVE SEED AND RIGHT SENTINEL LYMPH NODE MAPPING (Right) - PEC BLOCK as a surgical intervention.  The patient's history has been reviewed, patient examined, no change in status, stable for surgery.  I have reviewed the patient's chart and labs.  Questions were answered to the patient's satisfaction.     [name]

## 2019-07-20 NOTE — H&P (Signed)
Donna Whitney  Location: [facility] Patient #: [id number] DOB: 02/16/1956 Undefined / Language: [name] / Race: White Female  History of Present Illness  Patient words: Pt seen at the request of Dr Lisbeth Renshaw for right breast mass present for 1 month under right nipple U/S shows 0.7 cm subareolar mass IDC ER POS PR POS HER 2 NEU NEGATIVE KI 67 10 % U/S AXILLA NEGATIVE NO OTHER COMPLAINTS SHE HAS HEMATOMA FROM BIOPSY.  The patient is a 63 year old female.   Medication History  Medications Reconciled     Review of Systems  All other systems negative   Physical Exam  General Mental Status-Alert. General Appearance-Consistent with stated age. Hydration-Well hydrated. Voice-Normal.  Head and Neck Head-normocephalic, atraumatic with no lesions or palpable masses. Trachea-midline. Thyroid Gland Characteristics - normal size and consistency.  Chest and Lung Exam Chest and lung exam reveals -quiet, even and easy respiratory effort with no use of accessory muscles and on auscultation, normal breath sounds, no adventitious sounds and normal vocal resonance. Inspection Chest Wall - Normal. Back - normal.  Breast Note: SEVERE BRUISING RIGHT PERIAREOLAR REGION LEFT BREAST NORMAL  Neurologic Neurologic evaluation reveals -alert and oriented x 3 with no impairment of recent or remote memory. Mental Status-Normal.  Musculoskeletal Normal Exam - Left-Upper Extremity Strength Normal and Lower Extremity Strength Normal. Normal Exam - Right-Upper Extremity Strength Normal and Lower Extremity Strength Normal.  Lymphatic Head & Neck  General Head & Neck Lymphatics: Bilateral - Description - Normal. Axillary  General Axillary Region: Bilateral - Description - Normal. Tenderness - Non Tender. Femoral & Inguinal - Did not examine.    Assessment & Plan   BREAST CANCER, RIGHT (C50.911) Impression: PT opted for right  breast lumpectomy and SLN mapping Risk of lumpectomy include bleeding, infection, seroma, more surgery, use of seed/wire, wound care, cosmetic deformity and the need for other treatments, death , blood clots, death. Pt agrees to proceed. Risk of sentinel lymph node mapping include bleeding, infection, lymphedema, shoulder pain. stiffness, dye allergy. cosmetic deformity , blood clots, death, need for more surgery. Pt agres to proceed.  Current Plans You are being scheduled for surgery- Our schedulers will call you.  You should hear from our office's scheduling department within 5 working days about the location, date, and time of surgery. We try to make accommodations for patient's preferences in scheduling surgery, but sometimes the OR schedule or the surgeon's schedule prevents [place] from making those accommodations.  If you have not heard from our office [phone]) in 5 working days, call the office and ask for your surgeon's nurse.  If you have other questions about your diagnosis, plan, or surgery, call the office and ask for your surgeon's nurse.  Pt Education - [facility] Breast Cancer Information Given - [employer] "Breast Journey" Package We discussed the staging and pathophysiology of breast cancer. We discussed all of the different options for treatment for breast cancer including surgery, chemotherapy, radiation therapy, Herceptin, and antiestrogen therapy. We discussed a sentinel lymph node biopsy as she does not appear to having lymph node involvement right now. We discussed the performance of that with injection of radioactive tracer and blue dye. We discussed that she would have an incision underneath her axillary hairline. We discussed that there is a bout a 10-20% chance of having a positive node with a sentinel lymph node biopsy and we will await the permanent pathology to make any other first further decisions in terms of her treatment. One of these options  might be to return to the  operating room to perform an axillary lymph node dissection. We discussed about a 1-2% risk lifetime of chronic shoulder pain as well as lymphedema associated with a sentinel lymph node biopsy. We discussed the options for treatment of the breast cancer which included lumpectomy versus a mastectomy. We discussed the performance of the lumpectomy with a wire placement. We discussed a 10-20% chance of a positive margin requiring reexcision in the operating room. We also discussed that she may need radiation therapy or antiestrogen therapy or both if she undergoes lumpectomy. We discussed the mastectomy and the postoperative care for that as well. We discussed that there is no difference in her survival whether she undergoes lumpectomy with radiation therapy or antiestrogen therapy versus a mastectomy. There is a slight difference in the local recurrence rate being 3-5% with lumpectomy and about 1% with a mastectomy. We discussed the risks of operation including bleeding, infection, possible reoperation. She understands her further therapy will be based on what her stages at the time of her operation.  Pt Education - [facility] breast cancer surgery: discussed with patient and provided information. Pt Education - [facility] Breast Biopsy HCI: discussed with patient and provided information. Pt Education - ABC (After Breast Cancer) Class Info: discussed with patient and provided information.

## 2019-07-20 NOTE — Anesthesia Postprocedure Evaluation (Signed)
Anesthesia Post Note  Patient: Donna Whitney  Procedure(s) Performed: RIGHT BREAST LUMPECTOMY WITH RADIOACTIVE SEED AND RIGHT SENTINEL LYMPH NODE MAPPING (Right Breast)     Patient location during evaluation: PACU Anesthesia Type: General Level of consciousness: awake and alert Pain management: pain level controlled Vital Signs Assessment: post-procedure vital signs reviewed and stable Respiratory status: spontaneous breathing, nonlabored ventilation, respiratory function stable and patient connected to nasal cannula oxygen Cardiovascular status: blood pressure returned to baseline and stable Postop Assessment: no apparent nausea or vomiting Anesthetic complications: no    Last Vitals:  Vitals:   07/20/19 1415 07/20/19 1515  BP: (!) 162/94 (!) 166/93  Pulse: 94   Resp: 13 16  Temp:  36.7 C  SpO2: 100%     Last Pain:  Vitals:   07/20/19 1515  TempSrc:   PainSc: 3                  Montez Hageman

## 2019-07-20 NOTE — Anesthesia Procedure Notes (Signed)
Anesthesia Regional Block: Pectoralis block   Pre-Anesthetic Checklist: ,, timeout performed, Correct Patient, Correct Site, Correct Laterality, Correct Procedure, Correct Position, site marked, Risks and benefits discussed,  Surgical consent,  Pre-op evaluation,  At surgeon's request and post-op pain management  Laterality: Right  Prep: Maximum Sterile Barrier Precautions used, chloraprep       Needles:  Injection technique: Single-shot  Needle Type: Echogenic Stimulator Needle     Needle Length: 10cm      Additional Needles:   Procedures:,,,, ultrasound used (permanent image in chart),,,,  Narrative:  Start time: 07/20/2019 11:05 AM End time: 07/20/2019 11:15 AM Injection made incrementally with aspirations every 5 mL.  Performed by: Personally  Anesthesiologist: Montez Hageman, MD  Additional Notes: Risks, benefits and alternative to block explained extensively.  Patient tolerated procedure well, without complications.

## 2019-07-20 NOTE — Anesthesia Preprocedure Evaluation (Signed)
Anesthesia Evaluation  Patient identified by MRN, date of birth, ID band Patient awake    Reviewed: Allergy & Precautions, NPO status , Patient's Chart, lab work & pertinent test results  History of Anesthesia Complications (+) PONV  Airway Mallampati: II  TM Distance: >3 FB Neck ROM: Full    Dental no notable dental hx.    Pulmonary neg pulmonary ROS, former smoker,    Pulmonary exam normal breath sounds clear to auscultation       Cardiovascular hypertension, Pt. on medications Normal cardiovascular exam Rhythm:Regular Rate:Normal     Neuro/Psych negative neurological ROS  negative psych ROS   GI/Hepatic Neg liver ROS, GERD  Controlled,  Endo/Other  negative endocrine ROS  Renal/GU negative Renal ROS  negative genitourinary   Musculoskeletal negative musculoskeletal ROS (+)   Abdominal   Peds negative pediatric ROS (+)  Hematology negative hematology ROS (+)   Anesthesia Other Findings   Reproductive/Obstetrics negative OB ROS                             Anesthesia Physical Anesthesia Plan  ASA: II  Anesthesia Plan: General   Post-op Pain Management:  Regional for Post-op pain   Induction: Intravenous  PONV Risk Score and Plan: 4 or greater and Ondansetron, Dexamethasone, Midazolam, Scopolamine patch - Pre-op and Treatment may vary due to age or medical condition  Airway Management Planned: LMA  Additional Equipment:   Intra-op Plan:   Post-operative Plan: Extubation in OR  Informed Consent: I have reviewed the patients History and Physical, chart, labs and discussed the procedure including the risks, benefits and alternatives for the proposed anesthesia with the patient or authorized representative who has indicated his/her understanding and acceptance.     Dental advisory given  Plan Discussed with: CRNA  Anesthesia Plan Comments:         Anesthesia Quick  Evaluation

## 2019-07-21 ENCOUNTER — Encounter (HOSPITAL_BASED_OUTPATIENT_CLINIC_OR_DEPARTMENT_OTHER): Payer: Self-pay | Admitting: Surgery

## 2019-07-22 ENCOUNTER — Other Ambulatory Visit: Payer: Self-pay | Admitting: Oncology

## 2019-07-23 ENCOUNTER — Telehealth: Payer: Self-pay | Admitting: *Deleted

## 2019-07-23 NOTE — Telephone Encounter (Signed)
Received order for oncoytpe testing. Requisitions faxed to pathology and [place].

## 2019-07-27 ENCOUNTER — Ambulatory Visit: Payer: BLUE CROSS/BLUE SHIELD | Admitting: Adult Health

## 2019-08-02 ENCOUNTER — Telehealth: Payer: Self-pay | Admitting: *Deleted

## 2019-08-02 DIAGNOSIS — C50211 Malignant neoplasm of upper-inner quadrant of right female breast: Secondary | ICD-10-CM

## 2019-08-02 DIAGNOSIS — Z170 Estrogen receptor positive status [ER+]: Secondary | ICD-10-CM

## 2019-08-02 NOTE — Telephone Encounter (Signed)
Received oncotype score of 24/10%. Physician team notified. Left vm for pt to return call to discuss results. Contact information provided. Referral placed for Dr. Lisbeth Renshaw.

## 2019-08-02 NOTE — Telephone Encounter (Signed)
Gave pt oncotype score of 24. Informed she does not need chemo and next step is xrt with Dr. Lisbeth Renshaw. Informed referral was placed and pt will receive call with appt date and time. Denies further questions or needs at this time. Encourage pt to call with concerns. Received verbal understanding.

## 2019-08-09 ENCOUNTER — Inpatient Hospital Stay: Payer: Medicare Other | Attending: Oncology | Admitting: Adult Health

## 2019-08-09 ENCOUNTER — Ambulatory Visit: Payer: Medicare Other | Attending: Surgery | Admitting: Physical Therapy

## 2019-08-09 ENCOUNTER — Encounter (HOSPITAL_COMMUNITY): Payer: Self-pay | Admitting: Oncology

## 2019-08-09 ENCOUNTER — Other Ambulatory Visit: Payer: Self-pay

## 2019-08-09 ENCOUNTER — Encounter: Payer: Self-pay | Admitting: Physical Therapy

## 2019-08-09 ENCOUNTER — Encounter: Payer: Self-pay | Admitting: Adult Health

## 2019-08-09 VITALS — BP 167/94 | HR 86 | Temp 98.0°F | Resp 20 | Ht <= 58 in | Wt 151.1 lb

## 2019-08-09 DIAGNOSIS — R293 Abnormal posture: Secondary | ICD-10-CM | POA: Insufficient documentation

## 2019-08-09 DIAGNOSIS — Z483 Aftercare following surgery for neoplasm: Secondary | ICD-10-CM | POA: Insufficient documentation

## 2019-08-09 DIAGNOSIS — Z170 Estrogen receptor positive status [ER+]: Secondary | ICD-10-CM | POA: Insufficient documentation

## 2019-08-09 DIAGNOSIS — M25611 Stiffness of right shoulder, not elsewhere classified: Secondary | ICD-10-CM | POA: Diagnosis present

## 2019-08-09 DIAGNOSIS — C50211 Malignant neoplasm of upper-inner quadrant of right female breast: Secondary | ICD-10-CM | POA: Diagnosis present

## 2019-08-09 DIAGNOSIS — Z87891 Personal history of nicotine dependence: Secondary | ICD-10-CM | POA: Diagnosis not present

## 2019-08-09 NOTE — Progress Notes (Signed)
[place]  [phone] [phone]     ID: Casimira Sutphin DOB: 1956-03-02  MR#: [id number]  IOM#:[id number]  Patient Care Team: Asencion Noble, MD as PCP - General (Internal Medicine) Mauro Kaufmann, RN as Oncology Nurse Navigator Rockwell Germany, RN as Oncology Nurse Navigator Erroll Luna, MD as Consulting Physician (General Surgery) Magrinat, Virgie Dad, MD as Consulting Physician (Oncology) Kyung Rudd, MD as Consulting Physician (Radiation Oncology) Linda Hedges, DO as Consulting Physician (Obstetrics and Gynecology) Richmond Campbell, MD as Consulting Physician (Gastroenterology) Scot Dock, NP OTHER MD:  CHIEF COMPLAINT: estrogen receptor positive breast cancer  CURRENT TREATMENT: Adjuvant radiation pending  HISTORY OF CURRENT ILLNESS: Donna Whitney presented with a 2 week history of a palpable area in the upper-inner quadrant of her right breast. She underwent bilateral diagnostic mammography with tomography and right breast ultrasonography at [facility] on 06/03/2019 showing: breast density category B; 7 mm lobulated region in the upper-inner right breast suspicious of malignancy.  Evaluation of the right axilla if performed was not documented in the ultrasound report.  Accordingly on 06/09/2019 she proceeded to biopsy of the right breast area in question. The pathology from this procedure ([id number]) showed: invasive ductal carcinoma, grade 3. Prognostic indicators significant for: estrogen receptor, 100% positive with strong staining intensity, and progesterone receptor, 30% positive, with weak staining intensity. Proliferation marker Ki67 at 10%. HER2 equivocal by immunohistochemistry (2+), but negative by fluorescent in situ hybridization. Two sets of cells were evaluated showing a signals ratio 1.63/1.62 and number per cell 4.25/4.5.  The patient's subsequent history is as detailed below.   INTERVAL HISTORY: Donna Whitney is here  today for follow up and evaluation of her right sided estrogen positive breast cancer.  She is doing well today.  Since her last visit she underwent right breast lumpectomy that demonstrated invasive ductal carcinoma, grade III, 1.3 cm.  Margins were negative and 5 sentinel nodes were negative for involvement.  An oncotype was done that resulted 24, giving her a 10% recurrence of breast cancer outside the breast with anti estrogens alone, and also in  REVIEW OF SYSTEMS: Dilara reports that she is healing well from the surgery.  She has some swelling and had to have fluid drawn off of her breast once.  She notes the swelling is getting better.  She has f/u with radiation oncology this week as well, and is due to see PT this afternoon.  [name] is no longer taking any pain medications.    Suprina denies any fever, chills, headaches or vision changes.  She is without any skin rashes, dysphagia, nausea, vomiting, bowel, or bladder changes.  She has no cough, shortness of breath, chest pain, or palpitations.  A detailed ROS was otherwise non contributory.     PAST MEDICAL HISTORY: Past Medical History:  Diagnosis Date  . Anxiety   . Cancer ([place]) 05/2019   right breast cancer  . Complication of anesthesia    trouble waking up  . Family history of breast cancer   . Family history of lung cancer   . GERD (gastroesophageal reflux disease)   . Hypertension   . IBS (irritable bowel syndrome)   . PONV (postoperative nausea and vomiting)   . Ulcerative colitis ([place])     PAST SURGICAL HISTORY: Past Surgical History:  Procedure Laterality Date  . BREAST LUMPECTOMY WITH RADIOACTIVE SEED AND SENTINEL LYMPH NODE BIOPSY Right 07/20/2019   Procedure: RIGHT BREAST LUMPECTOMY WITH RADIOACTIVE SEED AND RIGHT SENTINEL LYMPH NODE  MAPPING;  Surgeon: Erroll Luna, MD;  Location: [place];  Service: General;  Laterality: Right;  PEC BLOCK  . CESAREAN SECTION    . TONSILLECTOMY     as a child   . TUBAL LIGATION      FAMILY HISTORY: Family History  Problem Relation Age of Onset  . Lung cancer Maternal Grandmother   . Breast cancer Paternal Aunt        dx 28 or older   Patient's father was 40 years old when he died from MRSA following back surgery, complicated by heavy smoking and a prior heart attack. Patient's mother died from heart attack at age 41. The patient denies a family hx of ovarian cancer. She reports a paternal aunt with breast cancer. She has 3 siblings, 1 brother and 2 sisters. She also notes lung cancer in her maternal grandmother.   GYNECOLOGIC HISTORY:  No LMP recorded. Patient is postmenopausal. Menarche: 63 years old Age at first live birth: 63 years old [place] P 2 LMP 11/2013 Contraceptive: yes, stopped after 6 months due to elevated blood pressure HRT no  Hysterectomy? no BSO? 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 She is married. Husband [name] is her partner in this. 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 She lives at home with her husband. Son [name], age 65, lives in [place] as a Development worker, international aid. Daughter [name], age 65, lives in [place] as a Training and development officer for [employer]. She has two grandchildren. She attends [employer].    ADVANCED DIRECTIVES: Husband [name] is her HCPOA.   HEALTH MAINTENANCE: Social History   Tobacco Use  . Smoking status: Former Research scientist (life sciences)  . Smokeless tobacco: Never Used  Substance Use Topics  . Alcohol use: Yes    Comment: social  . Drug use: Never     Colonoscopy: ~2016, Dr. Earlean Shawl  PAP: 01/2018  Bone density: ~2018, [facility], "early osteoporosis"   No Known Allergies  Current Outpatient Medications  Medication Sig Dispense Refill  . famotidine (PEPCID) 20 MG tablet Take 20 mg by mouth daily. Pt only takes as needed    . ibuprofen (ADVIL) 800 MG tablet Take 1 tablet (800 mg total) by mouth every 8  (eight) hours as needed. 30 tablet 0  . methocarbamol (ROBAXIN) 500 MG tablet Take 500 mg by mouth every 6 (six) hours as needed for muscle spasms.    . NON FORMULARY Take 1 tablet by mouth daily. [place] High Potency Probiotic    . olmesartan (BENICAR) 20 MG tablet Take 20 mg by mouth daily.    Marland Kitchen oxyCODONE (OXY IR/ROXICODONE) 5 MG immediate release tablet Take 1 tablet (5 mg total) by mouth every 6 (six) hours as needed for severe pain. 15 tablet 0   No current facility-administered medications for this visit.     OBJECTIVE: Middle-aged white woman who appears stated age  There were no vitals filed for this visit.   There is no height or weight on file to calculate BMI.   Wt Readings from Last 3 Encounters:  07/20/19 146 lb 9.7 oz (66.5 kg)  06/16/19 149 lb 4.8 oz (67.7 kg)      ECOG FS:1 - Symptomatic but completely ambulatory GENERAL: Patient is a well appearing female in no acute distress HEENT:  Sclerae anicteric.  Mask in place.   Neck is supple.  NODES:  No cervical, supraclavicular, or axillary  lymphadenopathy palpated.  BREAST EXAM:  Right breast s/p lumpectomy, mild swelling and ecchymosis present, left breast not examined LUNGS:  Clear to auscultation bilaterally.  No wheezes or rhonchi. HEART:  Regular rate and rhythm. No murmur appreciated. ABDOMEN:  Soft, nontender.  Positive, normoactive bowel sounds. No organomegaly palpated. MSK:  No focal spinal tenderness to palpation. Full range of motion bilaterally in the upper extremities. EXTREMITIES:  No peripheral edema.   SKIN:  Clear with no obvious rashes or skin changes. No nail dyscrasia. NEURO:  Nonfocal. Well oriented.  Appropriate affect.    LAB RESULTS:  CMP     Component Value Date/Time   NA 138 07/16/2019 1211   K 4.3 07/16/2019 1211   CL 103 07/16/2019 1211   CO2 25 07/16/2019 1211   GLUCOSE 96 07/16/2019 1211   BUN 8 07/16/2019 1211   CREATININE 0.76 07/16/2019 1211   CREATININE 0.80 06/16/2019  1218   CALCIUM 9.7 07/16/2019 1211   PROT 7.1 07/16/2019 1211   ALBUMIN 4.5 07/16/2019 1211   AST 15 07/16/2019 1211   AST 15 06/16/2019 1218   ALT 14 07/16/2019 1211   ALT 14 06/16/2019 1218   ALKPHOS 54 07/16/2019 1211   BILITOT 0.8 07/16/2019 1211   BILITOT 0.8 06/16/2019 1218   GFRNONAA >60 07/16/2019 1211   GFRNONAA >60 06/16/2019 1218   GFRAA >60 07/16/2019 1211   GFRAA >60 06/16/2019 1218    No results found for: TOTALPROTELP, ALBUMINELP, A1GS, A2GS, BETS, BETA2SER, GAMS, MSPIKE, SPEI  No results found for: KPAFRELGTCHN, LAMBDASER, KAPLAMBRATIO  Lab Results  Component Value Date   WBC 8.0 07/16/2019   NEUTROABS 4.1 07/16/2019   HGB 13.3 07/16/2019   HCT 39.6 07/16/2019   MCV 87.8 07/16/2019   PLT 235 07/16/2019    _0 @  No results found for: LABCA2  No components found for: [id number]  No results for input(s): INR in the last 168 hours.  No results found for: LABCA2  No results found for: OEV035  No results found for: KKX381  No results found for: WEX937  No results found for: CA2729  No components found for: HGQUANT  No results found for: CEA1 / No results found for: CEA1   No results found for: AFPTUMOR  No results found for: CHROMOGRNA  No results found for: PSA1  No visits with results within 3 Day(s) from this visit.  Latest known visit with results is:  Admission on 07/20/2019, Discharged on 07/20/2019  Component Date Value Ref Range Status  . WBC 07/16/2019 8.0  4.0 - 10.5 K/uL Final  . RBC 07/16/2019 4.51  3.87 - 5.11 MIL/uL Final  . Hemoglobin 07/16/2019 13.3  12.0 - 15.0 g/dL Final  . HCT 07/16/2019 39.6  36.0 - 46.0 % Final  . MCV 07/16/2019 87.8  80.0 - 100.0 fL Final  . MCH 07/16/2019 29.5  26.0 - 34.0 pg Final  . MCHC 07/16/2019 33.6  30.0 - 36.0 g/dL Final  . RDW 07/16/2019 13.2  11.5 - 15.5 % Final  . Platelets 07/16/2019 235  150 - 400 K/uL Final  . nRBC 07/16/2019 0.0  0.0 - 0.2 % Final  . Neutrophils Relative  % 07/16/2019 51  % Final  . Neutro Abs 07/16/2019 4.1  1.7 - 7.7 K/uL Final  . Lymphocytes Relative 07/16/2019 37  % Final  . Lymphs Abs 07/16/2019 3.0  0.7 - 4.0 K/uL Final  . Monocytes Relative 07/16/2019 9  % Final  . Monocytes Absolute 07/16/2019 0.7  0.1 - 1.0 K/uL Final  . Eosinophils Relative 07/16/2019 2  % Final  . Eosinophils Absolute 07/16/2019 0.1  0.0 - 0.5 K/uL Final  . Basophils Relative 07/16/2019 1  % Final  . Basophils Absolute 07/16/2019 0.1  0.0 - 0.1 K/uL Final  . Immature Granulocytes 07/16/2019 0  % Final  . Abs Immature Granulocytes 07/16/2019 0.01  0.00 - 0.07 K/uL Final   Performed at [facility], [place] [address], [place], [place] [address]  . Sodium 07/16/2019 138  135 - 145 mmol/L Final  . Potassium 07/16/2019 4.3  3.5 - 5.1 mmol/L Final  . Chloride 07/16/2019 103  98 - 111 mmol/L Final  . CO2 07/16/2019 25  22 - 32 mmol/L Final  . Glucose, Bld 07/16/2019 96  70 - 99 mg/dL Final  . BUN 07/16/2019 8  8 - 23 mg/dL Final  . Creatinine, Ser 07/16/2019 0.76  0.44 - 1.00 mg/dL Final  . Calcium 07/16/2019 9.7  8.9 - 10.3 mg/dL Final  . Total Protein 07/16/2019 7.1  6.5 - 8.1 g/dL Final  . Albumin 07/16/2019 4.5  3.5 - 5.0 g/dL Final  . AST 07/16/2019 15  15 - 41 U/L Final  . ALT 07/16/2019 14  0 - 44 U/L Final  . Alkaline Phosphatase 07/16/2019 54  38 - 126 U/L Final  . Total Bilirubin 07/16/2019 0.8  0.3 - 1.2 mg/dL Final  . GFR calc non Af Amer 07/16/2019 >60  >60 mL/min Final  . GFR calc Af Amer 07/16/2019 >60  >60 mL/min Final  . Anion gap 07/16/2019 10  5 - 15 Final   Performed at [facility], [place] [address], [place], [place] [address]    (this displays the last labs from the last 3 days)  No results found for: TOTALPROTELP, ALBUMINELP, A1GS, A2GS, BETS, BETA2SER, GAMS, MSPIKE, SPEI (this displays SPEP labs)  No results found for: KPAFRELGTCHN, LAMBDASER, KAPLAMBRATIO (kappa/lambda light chains)  No results found for: HGBA, HGBA2QUANT,  HGBFQUANT, HGBSQUAN (Hemoglobinopathy evaluation)   No results found for: LDH  No results found for: IRON, TIBC, IRONPCTSAT (Iron and TIBC)  No results found for: FERRITIN  Urinalysis No results found for: COLORURINE, APPEARANCEUR, LABSPEC, PHURINE, GLUCOSEU, HGBUR, BILIRUBINUR, KETONESUR, PROTEINUR, UROBILINOGEN, NITRITE, LEUKOCYTESUR   STUDIES: Nm Sentinel Node Inj-no Rpt (breast)  Result Date: 07/20/2019 Sulfur colloid was injected by the nuclear medicine technologist for melanoma sentinel node.    ELIGIBLE FOR AVAILABLE RESEARCH PROTOCOL: no  ASSESSMENT: 63 y.o. Milus Glazier, [place] woman status post right breast upper inner quadrant biopsy 06/09/2019 for a clinical T1b NX, stage IA invasive ductal carcinoma, grade 3, estrogen and progesterone receptor positive, HER-2 nonamplified, with an MRD-1 of 10%.  (1) s/p right breaset lumpectomy on 07/20/2019 that showed IDC grade III, 1.3cm, margins negative.    (a) a total of 5 SLN were biopsied  (2) Oncotype 24/10%-chemotherapy not indicated  (3) adjuvant radiation pending  (4) antiestrogens to start at the completion of local treatment; bone density testing due 08/12/2019  (5) genetics testing on 06/23/2019: negative.  Genes tested: ATM, BRCA1, BRCA2, CDH1, CHEK2, PALB2, PTEN, STK11 and TP53.  APC, ATM, AXIN2, BARD1, BMPR1A, BRCA1, BRCA2, BRIP1, CDH1, CDKN2A (p14ARF), CDKN2A (p16INK4a), CKD4, CHEK2, CTNNA1, DICER1, EPCAM (Deletion/duplication testing only), GREM1 (promoter region deletion/duplication testing only), KIT, MEN1, MLH1, MSH2, MSH3, MSH6, MUTYH, NBN, NF1, NHTL1, PALB2, PDGFRA, PMS2, POLD1, POLE, PTEN, RAD50, RAD51C, RAD51D, RNF43, SDHB, SDHC, SDHD, SMAD4, SMARCA4. STK11, TP53, TSC1, TSC2, and VHL.  The  following genes were evaluated for sequence changes only: SDHA and HOXB13 c.251G>A variant only.   PLAN:  Gisell has tolerated surgery well.  She is healing well, and we are pleased with the outcome and her results.  We reviewed  that her oncotype score of 24 is consistent with a 10 risk of breast cancer recurrence outside the breast in 9 years with antiestrogens alone.  It also indicates that she would not benefit from chemotherapy.  She is happy to hear this, and is looking forward to meeting with radiation oncology to discuss her adjuvant radiation therapy.    Lindamarie is going to meet with PT this afternoon to review her range of motion, and talk about lymphedema prevention.  She is looking forward to this.    Sherlyn will undergo bone density testing later this week.  We reviewed that after she completes radiation, she will start anti estrogen therapy with either tamoxifen or Anastrozole.  I reviewed that Dr. Jana Hakim will review this with her in further detail at her next appointment, but we discussed that the bone density report will help to guide that discussion and decision making process.  She understands this.  Marjoria was running late today, and couldn't stay to touch base with Dr. Jana Hakim, per his and [facility].  I reviewed the overall plan with Dr. Jana Hakim after Malik's appointment completion, and he is in agreement.    We will see her back in about 6 weeks or so to discuss antiestrogen therapy.    Rakia has a good understanding of the overall plan. She agrees with it. She knows the goal of treatment in her case is cure. She will call with any problems that may develop before her next visit here.  A total of (30) minutes of face-to-face time was spent with this patient with greater than 50% of that time in counseling and care-coordination.   Wilber Bihari, NP   08/09/2019 10:48 AM Medical Oncology and Hematology [facility] [address] [place], [place] [address] Tel. [phone]    Fax. [phone]

## 2019-08-09 NOTE — Patient Instructions (Signed)

## 2019-08-09 NOTE — Patient Instructions (Signed)
Closed Chain: Shoulder Abduction / Adduction - on Wall    One hand on wall, step to side and return. Stepping causes shoulder to abduct and adduct. Step _5__ times, holding 5 seconds,_2__ times per day.  http://ss.exer.us/267   Copyright  VHI. All rights reserved.  Closed Chain: Shoulder Flexion / Extension - on Wall    Hands on wall, step backward. Return. Stepping causes shoulder flexion and extension Do __5_ times, holding 5 seconds, _2__ times per day.  http://ss.exer.us/265   Copyright  VHI. All rights reserved.

## 2019-08-09 NOTE — Therapy (Signed)
[place], [place], [address] Phone: [phone]   Fax:  [phone]  Physical Therapy Treatment  Patient Details  Name: Donna Whitney MRN: [id number] Date of Birth: [birth year] Referring Provider (PT): Dr. Erroll Luna   Encounter Date: 08/09/2019  PT End of Session - 08/09/19 1551    Visit Number  2    Number of Visits  2    PT Start Time  1502    PT Stop Time  5916    PT Time Calculation (min)  46 min    Activity Tolerance  Patient tolerated treatment well    Behavior During Therapy  [facility] for tasks assessed/performed       Past Medical History:  Diagnosis Date  . Anxiety   . Cancer ([place]) 05/2019   right breast cancer  . Complication of anesthesia    trouble waking up  . Family history of breast cancer   . Family history of lung cancer   . GERD (gastroesophageal reflux disease)   . Hypertension   . IBS (irritable bowel syndrome)   . PONV (postoperative nausea and vomiting)   . Ulcerative colitis [facility])     Past Surgical History:  Procedure Laterality Date  . BREAST LUMPECTOMY WITH RADIOACTIVE SEED AND SENTINEL LYMPH NODE BIOPSY Right 07/20/2019   Procedure: RIGHT BREAST LUMPECTOMY WITH RADIOACTIVE SEED AND RIGHT SENTINEL LYMPH NODE MAPPING;  Surgeon: Erroll Luna, MD;  Location: [facility];  Service: General;  Laterality: Right;  PEC BLOCK  . CESAREAN SECTION    . TONSILLECTOMY     as a child  . TUBAL LIGATION      There were no vitals filed for this visit.  Subjective Assessment - 08/09/19 1501    Subjective  Patient underwent a right lumpectomy and sentinel node biopsy on 07/20/2019 (5 negative nodes removed). Oncotype score was 24 so no chemo needed. She will begin radiaiton next week followed by anti-estrogen. She had a seroma drained today near her right axilla.    Pertinent History  Patient was diagnosed on 06/03/2019 with right grade III invasive ductal  carcinoma breast cancer. Patient underwent a right lumpectomy and sentinel node biopsy on 07/20/2019 (5 negative nodes removed). It is ER/PR positive and HER2 negative with a Ki67 of 10%.    Patient Stated Goals  See if my arm is ok    Currently in Pain?  Yes    Pain Score  7     Pain Location  Axilla    Pain Orientation  Right    Pain Descriptors / Indicators  Other (Comment)   Sensitive   Pain Type  Surgical pain    Pain Onset  1 to 4 weeks ago    Pain Frequency  Intermittent    Aggravating Factors   Wearing bra    Pain Relieving Factors  Removing bra         [facility] PT Assessment - 08/09/19 0001      Assessment   Medical Diagnosis  s/p right lumpectomy and SLNB    Referring Provider (PT)  Dr. Marcello Moores Cornett    Onset Date/Surgical Date  07/20/19    Hand Dominance  Right    Prior Therapy  Baselines      Precautions   Precautions  Other (comment)    Precaution Comments  recent surgery      Restrictions   Weight Bearing Restrictions  No      Balance Screen  Has the patient fallen in the past 6 months  No    Has the patient had a decrease in activity level because of a fear of falling?   No    Is the patient reluctant to leave their home because of a fear of falling?   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 She was drained today for her seroma but there is still some edema present on her right lateral breast.      Posture/Postural Control    Posture/Postural Control  Postural limitations    Postural Limitations  Rounded Shoulders;Forward head      ROM / Strength   AROM / PROM / Strength  AROM      AROM   AROM Assessment Site  Shoulder    Right/Left Shoulder  Right    Right Shoulder Extension  49 Degrees    Right Shoulder Flexion  139 Degrees    Right Shoulder ABduction  145 Degrees    Right Shoulder Internal Rotation  84 Degrees    Right Shoulder External Rotation  79 Degrees        LYMPHEDEMA/ONCOLOGY QUESTIONNAIRE - 08/09/19 1510      Type   Cancer Type  Right breast cancer      Surgeries   Lumpectomy Date  07/20/19    Sentinel Lymph Node Biopsy Date  08/09/19    Number Lymph Nodes Removed  5      Treatment   Active Chemotherapy Treatment  No    Past Chemotherapy Treatment  No    Active Radiation Treatment  No    Past Radiation Treatment  No    Current Hormone Treatment  No    Past Hormone Therapy  No      What other symptoms do you have   Are you Having Heaviness or Tightness  No    Are you having Pain  Yes    Are you having pitting edema  No    Is it Hard or Difficult finding clothes that fit  No    Do you have infections  No    Is there Decreased scar mobility  Yes    Stemmer Sign  No      Right Upper Extremity Lymphedema   10 cm Proximal to Olecranon Process  27.5 cm    Olecranon Process  24 cm    10 cm Proximal to Ulnar Styloid Process  19.5 cm    Just Proximal to Ulnar Styloid Process  14.9 cm    Across Hand at [employer]  18.2 cm    At [place] of 2nd Digit  5.8 cm      Left Upper Extremity Lymphedema   10 cm Proximal to Olecranon Process  27.1 cm    Olecranon Process  24.6 cm    10 cm Proximal to Ulnar  Styloid Process  18.7 cm    Just Proximal to Ulnar Styloid Process  14.8 cm    Across Hand at [employer]  17.5 cm    At [place] of 2nd Digit  5.6 cm        Quick Dash - 08/09/19 0001    Open a tight or new jar  Moderate difficulty    Do heavy household chores (wash walls,  wash floors)  Moderate difficulty    Carry a shopping bag or briefcase  Mild difficulty    Wash your back  Mild difficulty    Use a knife to cut food  No difficulty    Recreational activities in which you take some force or impact through your arm, shoulder, or hand (golf, hammering, tennis)  Moderate difficulty    During the past week, to what extent has your arm, shoulder or hand problem interfered with your normal social activities with family, friends, neighbors, or groups?  Modererately    During the past week, to what extent has your arm, shoulder or hand problem limited your work or other regular daily activities  Modererately    Arm, shoulder, or hand pain.  Moderate    Tingling (pins and needles) in your arm, shoulder, or hand  Mild    Difficulty Sleeping  No difficulty    DASH Score  34.09 %                     PT Education - 08/09/19 1513    Education Details  Lymphedema risk reduction and scar massage; HEP for closed chain flex/abd    Person(s) Educated  Patient    Methods  Explanation;Demonstration;Handout    Comprehension  Verbalized understanding;Returned demonstration          PT Long Term Goals - 08/09/19 1554      PT LONG TERM GOAL #1   Title  Patient will demonstrate she has regained full shoulder ROM and function post operatively compared to baseline assessment.    Time  8    Period  Weeks    Status  Partially Met            Plan - 08/09/19 1551    Clinical Impression Statement  Patient is doing well s/p right lumpectomy and sentinel node biopsy. She had a seroma that was drained earlier today. She still has edema present in her right breast and she was given compression foam to place in her bra today to help reduce edema. She lives 1 hour away and because her ROM is near baselines, we agreed she could try to exercise at home to regain end ROM. She knows to contact us if edema does not reduce in her breast, if hypersensitivity around incision  does not improve, or if she feels ROM does not improve.    PT Treatment/Interventions  ADLs/Self Care Home Management;Patient/family education;Therapeutic exercise    PT Next Visit Plan  D/C    PT Home Exercise Plan  Post op shoulder ROM HEP    Consulted and Agree with Plan of Care  Patient       Patient will benefit from skilled therapeutic intervention in order to improve the following deficits and impairments:  Pain, Impaired UE functional use, Decreased knowledge of precautions, Postural dysfunction, Decreased range of motion, Increased fascial restricitons, Decreased scar mobility  Visit Diagnosis: Malignant neoplasm of upper-inner quadrant of right breast in female, estrogen receptor positive (HCC)  Abnormal posture  Aftercare following surgery  for neoplasm  Stiffness of right shoulder, not elsewhere classified     Problem List Patient Active Problem List   Diagnosis Date Noted  . Genetic testing 06/23/2019  . Family history of breast cancer   . Family history of lung cancer   . Malignant neoplasm of upper-inner quadrant of right breast in female, estrogen receptor positive ([place]) 06/14/2019   PHYSICAL THERAPY DISCHARGE SUMMARY  Visits from Start of Care: 2  Current functional level related to goals / functional outcomes: See above for objective measurements   Remaining deficits: She is lacking about 10 degrees of right shoulder flexion and abduction but was instructed in closed chain exercises today to regain end ROM. She has some hypersensitivity around her incision sites but was educated on how to desensitize those areas. She has edema present in right breast but seroma was drained by her surgeon today and she was given compression foam to place in her bra.    Education / Equipment: HEP and lymphedema risk reduction information Plan: Patient agrees to discharge.  Patient goals were partially met. Patient is being discharged due to being pleased with the current  functional level.  ?????        Latorria Zeoli, [place] 08/09/19 3:57 PM   [place], [place], [address] Phone: [phone]   Fax:  [phone]  Name: Elika Godar MRN: [id number] Date of Birth: [birth year]

## 2019-08-10 ENCOUNTER — Ambulatory Visit
Admission: RE | Admit: 2019-08-10 | Discharge: 2019-08-10 | Disposition: A | Discharge status: Home / Self Care | Payer: Medicare Other | Source: Ambulatory Visit | Attending: Urology | Admitting: Urology

## 2019-08-10 ENCOUNTER — Ambulatory Visit
Admission: RE | Admit: 2019-08-10 | Discharge: 2019-08-10 | Disposition: A | Discharge status: Home / Self Care | Payer: Medicare Other | Source: Ambulatory Visit | Attending: Radiation Oncology | Admitting: Radiation Oncology

## 2019-08-10 ENCOUNTER — Telehealth: Payer: Self-pay | Admitting: Adult Health

## 2019-08-10 ENCOUNTER — Encounter: Payer: Self-pay | Admitting: Adult Health

## 2019-08-10 DIAGNOSIS — K529 Noninfective gastroenteritis and colitis, unspecified: Secondary | ICD-10-CM

## 2019-08-10 DIAGNOSIS — Z170 Estrogen receptor positive status [ER+]: Secondary | ICD-10-CM

## 2019-08-10 DIAGNOSIS — C50211 Malignant neoplasm of upper-inner quadrant of right female breast: Secondary | ICD-10-CM

## 2019-08-10 NOTE — Progress Notes (Signed)
Phone call with patient denies any issues or complaints of at this time.

## 2019-08-10 NOTE — Patient Instructions (Signed)
Coronavirus (COVID-19) Are you at risk?  Are you at risk for the Coronavirus (COVID-19)?  To be considered HIGH RISK for Coronavirus (COVID-19), you have to meet the following criteria:  . Traveled to [place], [place], [place], [place] or [place]; or in the [place] to [place], [place], [place], or [place]; and have fever, cough, and shortness of breath within the last 2 weeks of travel OR . Been in close contact with a person diagnosed with COVID-19 within the last 2 weeks and have fever, cough, and shortness of breath . IF YOU DO NOT MEET THESE CRITERIA, YOU ARE CONSIDERED LOW RISK FOR COVID-19.  What to do if you are HIGH RISK for COVID-19?  . If you are having a medical emergency, call 911. . Seek medical care right away. Before you go to a doctor's office, urgent care or emergency department, call ahead and tell them about your recent travel, contact with someone diagnosed with COVID-19, and your symptoms. You should receive instructions from your physician's office regarding next steps of care.  . When you arrive at healthcare provider, tell the healthcare staff immediately you have returned from visiting [place], [place], [place], [place] or [place]; or traveled in the [place] to [place], [place], [place], or [place]; in the last two weeks or you have been in close contact with a person diagnosed with COVID-19 in the last 2 weeks.   . Tell the health care staff about your symptoms: fever, cough and shortness of breath. . After you have been seen by a medical provider, you will be either: o Tested for (COVID-19) and discharged home on quarantine except to seek medical care if symptoms worsen, and asked to  - Stay home and avoid contact with others until you get your results (4-5 days)  - Avoid travel on public transportation if possible (such as bus, train, or airplane) or o Sent to the Emergency Department by EMS for evaluation, COVID-19 testing, and possible  admission depending on your condition and test results.  What to do if you are LOW RISK for COVID-19?  Reduce your risk of any infection by using the same precautions used for avoiding the common cold or flu:  . Wash your hands often with soap and warm water for at least 20 seconds.  If soap and water are not readily available, use an alcohol-based hand sanitizer with at least 60% alcohol.  . If coughing or sneezing, cover your mouth and nose by coughing or sneezing into the elbow areas of your shirt or coat, into a tissue or into your sleeve (not your hands). . Avoid shaking hands with others and consider head nods or verbal greetings only. . Avoid touching your eyes, nose, or mouth with unwashed hands.  . Avoid close contact with people who are sick. . Avoid places or events with large numbers of people in one location, like concerts or sporting events. . Carefully consider travel plans you have or are making. . If you are planning any travel outside or inside the [place], visit the CDC's Travelers' Health webpage for the latest health notices. . If you have some symptoms but not all symptoms, continue to monitor at home and seek medical attention if your symptoms worsen. . If you are having a medical emergency, call 911.   ADDITIONAL HEALTHCARE OPTIONS FOR PATIENTS  [place] [facility] Tele[facility] / e-Visit: [email or web address]         [facility]: [phone]  [facility]: [phone]                   [facility]: [phone]   

## 2019-08-10 NOTE — Telephone Encounter (Signed)
I talk with patient regarding schedule  

## 2019-08-10 NOTE — Progress Notes (Signed)
Radiation Oncology         [phone] ________________________________  Outpatient Follow Up - Conducted via telephone due to current COVID-19 concerns for limiting patient exposure  I spoke with the patient to conduct this consult visit via telephone to spare the patient unnecessary potential exposure in the healthcare setting during the current COVID-19 pandemic. The patient was notified in advance and was offered a [place] meeting to allow for face to face communication but unfortunately reported that they did not have the appropriate resources/technology to support such a visit and instead preferred to proceed with a telephone visit.   ________________________________  Name: Donna Whitney        MRN: [id number]  Date of Service: 08/10/2019 DOB: 1955-12-11  BH:ALPFX, Carloyn Manner, MD  Magrinat, Virgie Dad, MD     REFERRING PHYSICIAN: Magrinat, Virgie Dad, MD   DIAGNOSIS: There were no encounter diagnoses.   HISTORY OF PRESENT ILLNESS: Donna Whitney is a 63 y.o. female originally seen in the multidisciplinary breast clinic for a new diagnosis of right breast cancer. The patient was noted to have a palpable mass in the right breast for two weeks and has a history of previously benign biopsies. She underwent diagnostic imaging which revealed a 1 cm density on mammography, and on ultrasound at 1:00, there was a 18m mass. The axilla was negative for adenopathy. She underwent a biopsy of the lesion on 06/09/2019 that revealed a grade 3 invasive ductal carcinoma. Her tumor was ER/PR positive, her HER2 was equivocal by IHC but negative by FISH. Her Ki 67 was 10%. She underwent a right lumpectomy with sentinel node biopsy on 07/20/2019 revealing a grade 3, 1.3 cm invasive ductal carcinoma with clear margins, and 5 nodes that were negative for disease. She had an Oncotype Dx score of 24, and she will not need systemic chemotherapy. She is contacted today to discuss adjuvant radiotherapy.    PREVIOUS  RADIATION THERAPY: No   PAST MEDICAL HISTORY:  Past Medical History:  Diagnosis Date   Anxiety    [place]   right breast cancer   Complication of anesthesia    trouble waking up   Family history of breast cancer    Family history of lung cancer    GERD (gastroesophageal reflux disease)    Hypertension    IBS (irritable bowel syndrome)    PONV (postoperative nausea and vomiting)    Ulcerative colitis ([place]        PAST SURGICAL HISTORY: Past Surgical History:  Procedure Laterality Date   BREAST LUMPECTOMY WITH RADIOACTIVE SEED AND SENTINEL LYMPH NODE BIOPSY Right 07/20/2019   Procedure: RIGHT BREAST LUMPECTOMY WITH RADIOACTIVE SEED AND RIGHT SENTINEL LYMPH NODE MAPPING;  Surgeon: CErroll Luna MD;  [facility]  Service: General;  Laterality: Right;  [place]    as a child   TUBAL LIGATION       FAMILY HISTORY:  Family History  Problem Relation Age of Onset   Lung cancer Maternal Grandmother    Breast cancer Paternal Aunt        dx 528or older     SOCIAL HISTORY:  reports that she has quit smoking. She has never used smokeless tobacco. She reports current alcohol use. She reports that she does not use drugs. 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    ALLERGIES: Patient has no known allergies.   MEDICATIONS:  Current Outpatient Medications  Medication Sig Dispense Refill   famotidine (PEPCID) 20 MG tablet Take 20 mg by mouth daily. Pt only takes as needed     methocarbamol (ROBAXIN) 500 MG tablet Take 500 mg by mouth every 6 (six) hours as needed for muscle spasms.     NON FORMULARY Take 1 tablet by mouth daily. [place] High Potency Probiotic     olmesartan (BENICAR) 20 MG tablet Take 20 mg by mouth daily.     No current facility-administered medications  for this encounter.      REVIEW OF SYSTEMS: On review of systems, the patient reports that she is doing well overall. She continues to have concerns with colitis. She is nervous about having an accident on treatment dates.  She denies any chest pain, shortness of breath, cough, fevers, chills, night sweats, unintended weight changes. She denies any  bladder disturbances, and denies abdominal pain, nausea or vomiting. She denies any new musculoskeletal or joint aches or pains. A complete review of systems is obtained and is otherwise negative.     PHYSICAL EXAM:  Unable to assess due to encounter type.   ECOG = 0  0 - Asymptomatic (Fully active, able to carry on all predisease activities without restriction)  1 - Symptomatic but completely ambulatory (Restricted in physically strenuous activity but ambulatory and able to carry out work of a light or sedentary nature. For example, light housework, office work)  2 - Symptomatic, <50% in bed during the day (Ambulatory and capable of all self care but unable to carry out any work activities. Up and about more than 50% of waking hours)  3 - Symptomatic, >50% in bed, but not bedbound (Capable of only limited self-care, confined to bed or chair 50% or more of waking hours)  4 - Bedbound (Completely disabled. Cannot carry on any self-care. Totally confined to bed or chair)  5 - Death   Eustace Pen MM, Creech RH, Tormey DC, et al. [phone]). "Toxicity and response criteria of the [facility]". [place] Oncol. 5 (6): 649-55    LABORATORY DATA:  Lab Results  Component Value Date   WBC 8.0 07/16/2019   HGB 13.3 07/16/2019   HCT 39.6 07/16/2019   MCV 87.8 07/16/2019   PLT 235 07/16/2019   Lab Results  Component Value Date   NA 138 07/16/2019   K 4.3 07/16/2019   CL 103 07/16/2019   CO2 25 07/16/2019   Lab Results  Component Value Date   ALT 14 07/16/2019   AST 15 07/16/2019   ALKPHOS 54 07/16/2019   BILITOT 0.8  07/16/2019      RADIOGRAPHY: Nm Sentinel Node Inj-no Rpt (breast)  Result Date: 07/20/2019 Sulfur colloid was injected by the nuclear medicine technologist for melanoma sentinel node.       IMPRESSION/PLAN: 1. Stage IA, [id number] grade 3, ER/PR positive invasive ductal carcinoma of the right breast. Dr. Lisbeth Renshaw discusses the final pathology findings and reviews the nature of invasive breast disease. She does not require systemic chemotherapy for treatment but would benefit in reducing risks of recurrence with external radiotherapy to the breast followed by antiestrogen therapy. We discussed the risks, benefits, short, and long term effects of radiotherapy, and the patient is interested in proceeding. Dr. Lisbeth Renshaw discusses the delivery and logistics of radiotherapy and recommends a course of 4 weeks of radiotherapy. She is scheduled  for simulation this Thursday when she will sign written consent to proceed. 2. Colitis. This is a chronic condition, and she will take imodium prn if she's worried about having an accident and follow up with her GI providers as well as needed.  Given current concerns for patient exposure during the COVID-19 pandemic, this encounter was conducted via telephone.  The patient has given verbal consent for this type of encounter. The time spent during this encounter was 25 minutes and 50% of that time was spent in the coordination of her care. The attendants for this meeting included Brandt Loosen, RN, Dr. Lisbeth Renshaw, and  Shona Simpson, [facility] and Wilma Flavin  During the encounter,  Brandt Loosen, RN, Dr. Lisbeth Renshaw, an Shona Simpson [facility] were located at [facility] Radiation Oncology Department.  Chestina Komatsu  was located at home.  The above documentation reflects my direct findings during this shared patient visit. Please see the separate note by Dr. Lisbeth Renshaw on this date for the remainder of the patient's plan of care.    Carola Rhine, PAC

## 2019-08-12 ENCOUNTER — Other Ambulatory Visit: Payer: Self-pay

## 2019-08-12 ENCOUNTER — Encounter: Payer: Self-pay | Admitting: *Deleted

## 2019-08-12 ENCOUNTER — Ambulatory Visit
Admission: RE | Admit: 2019-08-12 | Discharge: 2019-08-12 | Disposition: A | Discharge status: Home / Self Care | Payer: Medicare Other | Source: Ambulatory Visit | Attending: Radiation Oncology | Admitting: Radiation Oncology

## 2019-08-12 ENCOUNTER — Encounter: Payer: Self-pay | Admitting: Adult Health

## 2019-08-12 DIAGNOSIS — Z510 Encounter for antineoplastic radiation therapy: Secondary | ICD-10-CM | POA: Diagnosis not present

## 2019-08-12 DIAGNOSIS — C50211 Malignant neoplasm of upper-inner quadrant of right female breast: Secondary | ICD-10-CM | POA: Diagnosis not present

## 2019-08-12 DIAGNOSIS — Z170 Estrogen receptor positive status [ER+]: Secondary | ICD-10-CM | POA: Diagnosis not present

## 2019-08-12 NOTE — Progress Notes (Signed)
  Radiation Oncology         [phone]) [phone] ________________________________  Name: Emilce Stadtler MRN: [id number]  Date: 08/12/2019  DOB: 1956-10-11   DIAGNOSIS:     ICD-10-CM   1. Malignant neoplasm of upper-inner quadrant of right breast in female, estrogen receptor positive ([place])  C50.211    Z17.0     SIMULATION AND TREATMENT PLANNING NOTE  The patient presented for simulation prior to beginning her course of radiation treatment for her diagnosis of right-sided breast cancer. The patient was placed in a supine position on a breast board. A customized vac-lock bag was constructed and this complex treatment device will be used on a daily basis during her treatment. In this fashion, a CT scan was obtained through the chest area and an isocenter was placed near the chest wall within the breast.  The patient will be planned to receive a course of radiation initially to a dose of 42.56 Gy. This will consist of a whole breast radiotherapy technique. To accomplish this, 2 customized blocks have been designed which will correspond to medial and lateral whole breast tangent fields. This treatment will be accomplished at 2.66 Gy per fraction. A forward planning technique will also be evaluated to determine if this approach improves the plan. It is anticipated that the patient will then receive a 8 Gy boost to the seroma cavity which has been contoured. This will be accomplished at 2 Gy per fraction.   This initial treatment will consist of a 3-D conformal technique. The seroma has been contoured as the primary target structure. Additionally, dose volume histograms of both this target as well as the lungs and heart will also be evaluated. Such an approach is necessary to ensure that the target area is adequately covered while the nearby critical  normal structures are adequately spared.  Plan:  The final anticipated total dose therefore will correspond to 50.56 Gy.     _______________________________   Jodelle Gross, MD, PhD

## 2019-08-12 NOTE — Progress Notes (Signed)
  Radiation Oncology         [phone] ________________________________  Name: Donna Whitney MRN: [id number]  Date: 08/12/2019  DOB: [birth year]  Optical Surface Tracking Plan:  Since intensity modulated radiotherapy (IMRT) and 3D conformal radiation treatment methods are predicated on accurate and precise positioning for treatment, intrafraction motion monitoring is medically necessary to ensure accurate and safe treatment delivery.  The ability to quantify intrafraction motion without excessive ionizing radiation dose can only be performed with optical surface tracking. Accordingly, surface imaging offers the opportunity to obtain 3D measurements of patient position throughout IMRT and 3D treatments without excessive radiation exposure.  I am ordering optical surface tracking for this patient's upcoming course of radiotherapy. ________________________________  Kyung Rudd, MD 08/12/2019 6:40 PM    Reference:   Particia Jasper, et al. Surface imaging-based analysis of intrafraction motion for breast radiotherapy patients.Journal of [place], n. 6, nov. 2014. ISSN [id number].   Available at: <http://www.jacmp.org/index.php/jacmp/article/view/4957>.

## 2019-08-13 ENCOUNTER — Encounter: Payer: Self-pay | Admitting: *Deleted

## 2019-08-18 DIAGNOSIS — Z510 Encounter for antineoplastic radiation therapy: Secondary | ICD-10-CM | POA: Diagnosis not present

## 2019-08-23 ENCOUNTER — Other Ambulatory Visit: Payer: Self-pay

## 2019-08-23 ENCOUNTER — Ambulatory Visit
Admission: RE | Admit: 2019-08-23 | Discharge: 2019-08-23 | Disposition: A | Discharge status: Home / Self Care | Payer: Medicare Other | Source: Ambulatory Visit | Attending: Radiation Oncology | Admitting: Radiation Oncology

## 2019-08-23 DIAGNOSIS — Z510 Encounter for antineoplastic radiation therapy: Secondary | ICD-10-CM | POA: Insufficient documentation

## 2019-08-23 DIAGNOSIS — C50211 Malignant neoplasm of upper-inner quadrant of right female breast: Secondary | ICD-10-CM | POA: Insufficient documentation

## 2019-08-23 DIAGNOSIS — Z170 Estrogen receptor positive status [ER+]: Secondary | ICD-10-CM | POA: Insufficient documentation

## 2019-08-24 ENCOUNTER — Other Ambulatory Visit: Payer: Self-pay

## 2019-08-24 ENCOUNTER — Ambulatory Visit
Admission: RE | Admit: 2019-08-24 | Discharge: 2019-08-24 | Disposition: A | Discharge status: Home / Self Care | Payer: Medicare Other | Source: Ambulatory Visit | Attending: Radiation Oncology | Admitting: Radiation Oncology

## 2019-08-24 DIAGNOSIS — Z510 Encounter for antineoplastic radiation therapy: Secondary | ICD-10-CM | POA: Diagnosis not present

## 2019-08-25 ENCOUNTER — Other Ambulatory Visit: Payer: Self-pay

## 2019-08-25 ENCOUNTER — Ambulatory Visit
Admission: RE | Admit: 2019-08-25 | Discharge: 2019-08-25 | Disposition: A | Discharge status: Home / Self Care | Payer: Medicare Other | Source: Ambulatory Visit | Attending: Radiation Oncology | Admitting: Radiation Oncology

## 2019-08-25 DIAGNOSIS — Z510 Encounter for antineoplastic radiation therapy: Secondary | ICD-10-CM | POA: Diagnosis not present

## 2019-08-26 ENCOUNTER — Ambulatory Visit
Admission: RE | Admit: 2019-08-26 | Discharge: 2019-08-26 | Disposition: A | Discharge status: Home / Self Care | Payer: Medicare Other | Source: Ambulatory Visit | Attending: Radiation Oncology | Admitting: Radiation Oncology

## 2019-08-26 ENCOUNTER — Other Ambulatory Visit: Payer: Self-pay

## 2019-08-26 DIAGNOSIS — Z510 Encounter for antineoplastic radiation therapy: Secondary | ICD-10-CM | POA: Diagnosis not present

## 2019-08-27 ENCOUNTER — Other Ambulatory Visit: Payer: Self-pay

## 2019-08-27 ENCOUNTER — Ambulatory Visit
Admission: RE | Admit: 2019-08-27 | Discharge: 2019-08-27 | Disposition: A | Discharge status: Home / Self Care | Payer: Medicare Other | Source: Ambulatory Visit | Attending: Radiation Oncology | Admitting: Radiation Oncology

## 2019-08-27 DIAGNOSIS — Z510 Encounter for antineoplastic radiation therapy: Secondary | ICD-10-CM | POA: Diagnosis not present

## 2019-08-27 DIAGNOSIS — Z170 Estrogen receptor positive status [ER+]: Secondary | ICD-10-CM

## 2019-08-27 DIAGNOSIS — C50211 Malignant neoplasm of upper-inner quadrant of right female breast: Secondary | ICD-10-CM

## 2019-08-27 MED ORDER — RADIAPLEXRX EX GEL
Freq: Once | CUTANEOUS | Status: AC
  Administered 2019-08-27: 13:00:00 via TOPICAL

## 2019-08-27 MED ORDER — ALRA NON-METALLIC DEODORANT (RAD-ONC)
1.0000 "application " | Freq: Once | TOPICAL | Status: AC
  Administered 2019-08-27: 1 via TOPICAL

## 2019-08-27 NOTE — Progress Notes (Signed)
Pt here for patient teaching.  Pt given Radiation and You booklet, skin care instructions, Alra deodorant and Radiaplex gel.  Reviewed areas of pertinence such as fatigue, hair loss, skin changes, breast tenderness and breast swelling . Pt able to give teach back of to pat skin and use unscented/gentle soap,apply Radiaplex bid, avoid applying anything to skin within 4 hours of treatment, avoid wearing an under wire bra and to use an electric razor if they must shave. Pt verbalizes understanding of information given and will contact nursing with any questions or concerns.     Mykell Genao M. Tashera Montalvo RN, BSN      

## 2019-08-30 ENCOUNTER — Ambulatory Visit
Admission: RE | Admit: 2019-08-30 | Discharge: 2019-08-30 | Disposition: A | Discharge status: Home / Self Care | Payer: Medicare Other | Source: Ambulatory Visit | Attending: Radiation Oncology | Admitting: Radiation Oncology

## 2019-08-30 ENCOUNTER — Other Ambulatory Visit: Payer: Self-pay

## 2019-08-30 DIAGNOSIS — Z510 Encounter for antineoplastic radiation therapy: Secondary | ICD-10-CM | POA: Diagnosis not present

## 2019-08-31 ENCOUNTER — Ambulatory Visit
Admission: RE | Admit: 2019-08-31 | Discharge: 2019-08-31 | Disposition: A | Discharge status: Home / Self Care | Payer: Medicare Other | Source: Ambulatory Visit | Attending: Radiation Oncology | Admitting: Radiation Oncology

## 2019-08-31 ENCOUNTER — Other Ambulatory Visit: Payer: Self-pay

## 2019-08-31 DIAGNOSIS — Z510 Encounter for antineoplastic radiation therapy: Secondary | ICD-10-CM | POA: Diagnosis not present

## 2019-09-01 ENCOUNTER — Other Ambulatory Visit: Payer: Self-pay

## 2019-09-01 ENCOUNTER — Ambulatory Visit
Admission: RE | Admit: 2019-09-01 | Discharge: 2019-09-01 | Disposition: A | Discharge status: Home / Self Care | Payer: Medicare Other | Source: Ambulatory Visit | Attending: Radiation Oncology | Admitting: Radiation Oncology

## 2019-09-01 DIAGNOSIS — Z510 Encounter for antineoplastic radiation therapy: Secondary | ICD-10-CM | POA: Diagnosis not present

## 2019-09-02 ENCOUNTER — Ambulatory Visit
Admission: RE | Admit: 2019-09-02 | Discharge: 2019-09-02 | Disposition: A | Discharge status: Home / Self Care | Payer: Medicare Other | Source: Ambulatory Visit | Attending: Radiation Oncology | Admitting: Radiation Oncology

## 2019-09-02 ENCOUNTER — Other Ambulatory Visit: Payer: Self-pay

## 2019-09-02 DIAGNOSIS — Z510 Encounter for antineoplastic radiation therapy: Secondary | ICD-10-CM | POA: Diagnosis not present

## 2019-09-03 ENCOUNTER — Ambulatory Visit
Admission: RE | Admit: 2019-09-03 | Discharge: 2019-09-03 | Disposition: A | Discharge status: Home / Self Care | Payer: Medicare Other | Source: Ambulatory Visit | Attending: Radiation Oncology | Admitting: Radiation Oncology

## 2019-09-03 ENCOUNTER — Other Ambulatory Visit: Payer: Self-pay

## 2019-09-03 DIAGNOSIS — Z510 Encounter for antineoplastic radiation therapy: Secondary | ICD-10-CM | POA: Diagnosis not present

## 2019-09-06 ENCOUNTER — Ambulatory Visit
Admission: RE | Admit: 2019-09-06 | Discharge: 2019-09-06 | Disposition: A | Discharge status: Home / Self Care | Payer: Medicare Other | Source: Ambulatory Visit | Attending: Radiation Oncology | Admitting: Radiation Oncology

## 2019-09-06 ENCOUNTER — Other Ambulatory Visit: Payer: Self-pay

## 2019-09-06 DIAGNOSIS — Z510 Encounter for antineoplastic radiation therapy: Secondary | ICD-10-CM | POA: Diagnosis not present

## 2019-09-07 ENCOUNTER — Other Ambulatory Visit: Payer: Self-pay

## 2019-09-07 ENCOUNTER — Ambulatory Visit
Admission: RE | Admit: 2019-09-07 | Discharge: 2019-09-07 | Disposition: A | Discharge status: Home / Self Care | Payer: Medicare Other | Source: Ambulatory Visit | Attending: Radiation Oncology | Admitting: Radiation Oncology

## 2019-09-07 DIAGNOSIS — Z510 Encounter for antineoplastic radiation therapy: Secondary | ICD-10-CM | POA: Diagnosis not present

## 2019-09-08 ENCOUNTER — Ambulatory Visit
Admission: RE | Admit: 2019-09-08 | Discharge: 2019-09-08 | Disposition: A | Discharge status: Home / Self Care | Payer: Medicare Other | Source: Ambulatory Visit | Attending: Radiation Oncology | Admitting: Radiation Oncology

## 2019-09-08 ENCOUNTER — Other Ambulatory Visit: Payer: Self-pay

## 2019-09-08 DIAGNOSIS — Z510 Encounter for antineoplastic radiation therapy: Secondary | ICD-10-CM | POA: Diagnosis not present

## 2019-09-09 ENCOUNTER — Other Ambulatory Visit: Payer: Self-pay

## 2019-09-09 ENCOUNTER — Ambulatory Visit
Admission: RE | Admit: 2019-09-09 | Discharge: 2019-09-09 | Disposition: A | Discharge status: Home / Self Care | Payer: Medicare Other | Source: Ambulatory Visit | Attending: Radiation Oncology | Admitting: Radiation Oncology

## 2019-09-09 DIAGNOSIS — Z510 Encounter for antineoplastic radiation therapy: Secondary | ICD-10-CM | POA: Diagnosis not present

## 2019-09-10 ENCOUNTER — Ambulatory Visit
Admission: RE | Admit: 2019-09-10 | Discharge: 2019-09-10 | Disposition: A | Discharge status: Home / Self Care | Payer: Medicare Other | Source: Ambulatory Visit | Attending: Radiation Oncology | Admitting: Radiation Oncology

## 2019-09-10 ENCOUNTER — Other Ambulatory Visit: Payer: Self-pay

## 2019-09-10 DIAGNOSIS — Z510 Encounter for antineoplastic radiation therapy: Secondary | ICD-10-CM | POA: Diagnosis not present

## 2019-09-13 ENCOUNTER — Ambulatory Visit
Admission: RE | Admit: 2019-09-13 | Discharge: 2019-09-13 | Disposition: A | Discharge status: Home / Self Care | Payer: Medicare Other | Source: Ambulatory Visit | Attending: Radiation Oncology | Admitting: Radiation Oncology

## 2019-09-13 ENCOUNTER — Other Ambulatory Visit: Payer: Self-pay

## 2019-09-13 DIAGNOSIS — Z510 Encounter for antineoplastic radiation therapy: Secondary | ICD-10-CM | POA: Diagnosis not present

## 2019-09-14 ENCOUNTER — Other Ambulatory Visit: Payer: Self-pay

## 2019-09-14 ENCOUNTER — Ambulatory Visit
Admission: RE | Admit: 2019-09-14 | Discharge: 2019-09-14 | Disposition: A | Discharge status: Home / Self Care | Payer: Medicare Other | Source: Ambulatory Visit | Attending: Radiation Oncology | Admitting: Radiation Oncology

## 2019-09-14 DIAGNOSIS — Z510 Encounter for antineoplastic radiation therapy: Secondary | ICD-10-CM | POA: Diagnosis not present

## 2019-09-15 ENCOUNTER — Other Ambulatory Visit: Payer: Self-pay

## 2019-09-15 ENCOUNTER — Ambulatory Visit
Admission: RE | Admit: 2019-09-15 | Discharge: 2019-09-15 | Disposition: A | Discharge status: Home / Self Care | Payer: Medicare Other | Source: Ambulatory Visit | Attending: Radiation Oncology | Admitting: Radiation Oncology

## 2019-09-15 DIAGNOSIS — Z510 Encounter for antineoplastic radiation therapy: Secondary | ICD-10-CM | POA: Diagnosis not present

## 2019-09-16 ENCOUNTER — Ambulatory Visit
Admission: RE | Admit: 2019-09-16 | Discharge: 2019-09-16 | Disposition: A | Discharge status: Home / Self Care | Payer: Medicare Other | Source: Ambulatory Visit | Attending: Radiation Oncology | Admitting: Radiation Oncology

## 2019-09-16 ENCOUNTER — Other Ambulatory Visit: Payer: Self-pay

## 2019-09-16 DIAGNOSIS — Z510 Encounter for antineoplastic radiation therapy: Secondary | ICD-10-CM | POA: Diagnosis not present

## 2019-09-17 ENCOUNTER — Other Ambulatory Visit: Payer: Self-pay

## 2019-09-17 ENCOUNTER — Encounter: Payer: Self-pay | Admitting: *Deleted

## 2019-09-17 ENCOUNTER — Encounter: Payer: Self-pay | Admitting: Radiation Oncology

## 2019-09-17 ENCOUNTER — Ambulatory Visit
Admission: RE | Admit: 2019-09-17 | Discharge: 2019-09-17 | Disposition: A | Discharge status: Home / Self Care | Payer: Medicare Other | Source: Ambulatory Visit | Attending: Radiation Oncology | Admitting: Radiation Oncology

## 2019-09-17 DIAGNOSIS — Z510 Encounter for antineoplastic radiation therapy: Secondary | ICD-10-CM | POA: Diagnosis not present

## 2019-09-20 NOTE — Progress Notes (Signed)
[facility]  [phone] Fax:([phone]) [phone]     ID: Donna Whitney DOB: December 12, 1955  MR#: [id number]  YYQ#:[id number]  Patient Care Team: Asencion Noble, MD as PCP - General (Internal Medicine) Mauro Kaufmann, RN as Oncology Nurse Navigator Rockwell Germany, RN as Oncology Nurse Navigator Erroll Luna, MD as Consulting Physician (General Surgery) Magrinat, Virgie Dad, MD as Consulting Physician (Oncology) Kyung Rudd, MD as Consulting Physician (Radiation Oncology) Linda Hedges, DO as Consulting Physician (Obstetrics and Gynecology) Richmond Campbell, MD as Consulting Physician (Gastroenterology) Scot Dock, NP OTHER MD:  CHIEF COMPLAINT: estrogen receptor positive breast cancer  CURRENT TREATMENT: anti estrogen therapy  HISTORY OF CURRENT ILLNESS: Donna Whitney presented with a 2 week history of a palpable area in the upper-inner quadrant of her right breast. She underwent bilateral diagnostic mammography with tomography and right breast ultrasonography at [facility] on 06/03/2019 showing: breast density category B; 7 mm lobulated region in the upper-inner right breast suspicious of malignancy.  Evaluation of the right axilla if performed was not documented in the ultrasound report.  Accordingly on 06/09/2019 she proceeded to biopsy of the right breast area in question. The pathology from this procedure ([id number]) showed: invasive ductal carcinoma, grade 3. Prognostic indicators significant for: estrogen receptor, 100% positive with strong staining intensity, and progesterone receptor, 30% positive, with weak staining intensity. Proliferation marker Ki67 at 10%. HER2 equivocal by immunohistochemistry (2+), but negative by fluorescent in situ hybridization. Two sets of cells were evaluated showing a signals ratio 1.63/1.62 and number per cell 4.25/4.5.  The patient's subsequent history is as detailed below.   INTERVAL HISTORY: Donna Whitney is here today for  follow up and evaluation of her right sided estrogen positive breast cancer.  She is doing well today.  Since her last visit, she completed her adjuvant radiation therapy.  This wrapped up on 09/17/2019. She has some skin itching, and this is improving.  She has some peeling and redness of the right breast. She also underwent bone density testing on 08/12/2019 that showed osteopenia with a t score of -1.5 in the right femoral neck.    REVIEW OF SYSTEMS: Donna Whitney reports doing well today.  She is getting back to being active after completing her radiation.  She denies any fever or chills.  She is without any chest pain, palpitations, cough, shortness of breath, nausea, vomiting, bowel/bladder changes, headaches, vision issues, or any other concerns.  A detailed ROS was otherwise non contributory.     PAST MEDICAL HISTORY: Past Medical History:  Diagnosis Date  . Anxiety   . Cancer ([place]) 05/2019   right breast cancer  . Complication of anesthesia    trouble waking up  . Family history of breast cancer   . Family history of lung cancer   . GERD (gastroesophageal reflux disease)   . Hypertension   . IBS (irritable bowel syndrome)   . PONV (postoperative nausea and vomiting)   . Ulcerative colitis ([place])     PAST SURGICAL HISTORY: Past Surgical History:  Procedure Laterality Date  . BREAST LUMPECTOMY WITH RADIOACTIVE SEED AND SENTINEL LYMPH NODE BIOPSY Right 07/20/2019   Procedure: RIGHT BREAST LUMPECTOMY WITH RADIOACTIVE SEED AND RIGHT SENTINEL LYMPH NODE MAPPING;  Surgeon: Erroll Luna, MD;  Location: [facility];  Service: General;  Laterality: Right;  PEC BLOCK  . CESAREAN SECTION    . TONSILLECTOMY     as a child  . TUBAL LIGATION      FAMILY HISTORY: Family  History  Problem Relation Age of Onset  . Lung cancer Maternal Grandmother   . Breast cancer Paternal Aunt        dx 71 or older   Patient's father was 7 years old when he died from MRSA following back  surgery, complicated by heavy smoking and a prior heart attack. Patient's mother died from heart attack at age 17. The patient denies a family hx of ovarian cancer. She reports a paternal aunt with breast cancer. She has 3 siblings, 1 brother and 2 sisters. She also notes lung cancer in her maternal grandmother.   GYNECOLOGIC HISTORY:  No LMP recorded. Patient is postmenopausal. Menarche: 63 years old Age at first live birth: 63 years old Fults P 2 LMP 11/2013 Contraceptive: yes, stopped after 6 months due to elevated blood pressure HRT no  Hysterectomy? no BSO? no   SOCIAL HISTORY: (updated 06/16/2019)  Donna Whitney is currently working as a Programme researcher, broadcasting/film/video. She is married. Husband Donna Whitney is her partner in this. 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 She lives at home with her husband. Son Donna Whitney, age 4, lives in [place] as a Development worker, international aid. Daughter Donna Whitney, age 46, lives in [place] as a Training and development officer for [employer]. She has two grandchildren. She attends [employer].    ADVANCED DIRECTIVES: Husband Donna Whitney is her HCPOA.   HEALTH MAINTENANCE: Social History   Tobacco Use  . Smoking status: Former Research scientist (life sciences)  . Smokeless tobacco: Never Used  Substance Use Topics  . Alcohol use: Yes    Comment: social  . Drug use: Never     Colonoscopy: ~2016, Dr. Earlean Shawl  PAP: 01/2018  Bone density: ~2018, [facility], "early osteoporosis"   No Known Allergies  Current Outpatient Medications  Medication Sig Dispense Refill  . famotidine (PEPCID) 20 MG tablet Take 20 mg by mouth daily. Pt only takes as needed    . methocarbamol (ROBAXIN) 500 MG tablet Take 500 mg by mouth every 6 (six) hours as needed for muscle spasms.    . NON FORMULARY Take 1 tablet by mouth daily. [place] High Potency Probiotic    . olmesartan (BENICAR) 20 MG tablet Take 20 mg by mouth daily.    Marland Kitchen Zoster Vaccine Adjuvanted [facility]) injection Shingrix (PF) 50 mcg/0.5 mL  intramuscular suspension, kit     No current facility-administered medications for this visit.     OBJECTIVE: Middle-aged white woman who appears stated age  16:   09/21/19 1201  BP: 128/80  Pulse: 85  Resp: 17  Temp: 98.2 F (36.8 C)  SpO2: 100%     Body mass index is 26.62 kg/m.   Wt Readings from Last 3 Encounters:  09/21/19 155 lb 1.6 oz (70.4 kg)  08/09/19 151 lb 1.6 oz (68.5 kg)  07/20/19 146 lb 9.7 oz (66.5 kg)  ECOG FS:1 - Symptomatic but completely ambulatory GENERAL: Patient is a well appearing female in no acute distress HEENT:  Sclerae anicteric.  Mask in place.   Neck is supple.  NODES:  No cervical, supraclavicular, or axillary lymphadenopathy palpated.  BREAST EXAM:  Breasts inspected only.  Right breast is erythematous, no peeling noted, slight swelling.  Left breast benign LUNGS:  Clear to auscultation bilaterally.  No wheezes or rhonchi. HEART:  Regular rate and rhythm. No murmur appreciated. ABDOMEN:  Soft, nontender.  Positive, normoactive bowel sounds. No organomegaly palpated. MSK:  No focal spinal tenderness to palpation. Full range of motion bilaterally in the  upper extremities. EXTREMITIES:  No peripheral edema.   SKIN:  Clear with no obvious rashes or skin changes. No nail dyscrasia. NEURO:  Nonfocal. Well oriented.  Appropriate affect.    LAB RESULTS:  CMP     Component Value Date/Time   NA 138 07/16/2019 1211   K 4.3 07/16/2019 1211   CL 103 07/16/2019 1211   CO2 25 07/16/2019 1211   GLUCOSE 96 07/16/2019 1211   BUN 8 07/16/2019 1211   CREATININE 0.76 07/16/2019 1211   CREATININE 0.80 06/16/2019 1218   CALCIUM 9.7 07/16/2019 1211   PROT 7.1 07/16/2019 1211   ALBUMIN 4.5 07/16/2019 1211   AST 15 07/16/2019 1211   AST 15 06/16/2019 1218   ALT 14 07/16/2019 1211   ALT 14 06/16/2019 1218   ALKPHOS 54 07/16/2019 1211   BILITOT 0.8 07/16/2019 1211   BILITOT 0.8 06/16/2019 1218   GFRNONAA >60 07/16/2019 1211   GFRNONAA >60  06/16/2019 1218   GFRAA >60 07/16/2019 1211   GFRAA >60 06/16/2019 1218    No results found for: TOTALPROTELP, ALBUMINELP, A1GS, A2GS, BETS, BETA2SER, GAMS, MSPIKE, SPEI  No results found for: KPAFRELGTCHN, LAMBDASER, KAPLAMBRATIO  Lab Results  Component Value Date   WBC 8.0 07/16/2019   NEUTROABS 4.1 07/16/2019   HGB 13.3 07/16/2019   HCT 39.6 07/16/2019   MCV 87.8 07/16/2019   PLT 235 07/16/2019    _0 @  No results found for: LABCA2  No components found for: MOQHUT654  No results for input(s): INR in the last 168 hours.  No results found for: LABCA2  No results found for: YTK354  No results found for: SFK812  No results found for: XNT700  No results found for: CA2729  No components found for: HGQUANT  No results found for: CEA1 / No results found for: CEA1   No results found for: AFPTUMOR  No results found for: CHROMOGRNA  No results found for: PSA1  No visits with results within 3 Day(s) from this visit.  Latest known visit with results is:  Admission on 07/20/2019, Discharged on 07/20/2019  Component Date Value Ref Range Status  . WBC 07/16/2019 8.0  4.0 - 10.5 K/uL Final  . RBC 07/16/2019 4.51  3.87 - 5.11 MIL/uL Final  . Hemoglobin 07/16/2019 13.3  12.0 - 15.0 g/dL Final  . HCT 07/16/2019 39.6  36.0 - 46.0 % Final  . MCV 07/16/2019 87.8  80.0 - 100.0 fL Final  . MCH 07/16/2019 29.5  26.0 - 34.0 pg Final  . MCHC 07/16/2019 33.6  30.0 - 36.0 g/dL Final  . RDW 07/16/2019 13.2  11.5 - 15.5 % Final  . Platelets 07/16/2019 235  150 - 400 K/uL Final  . nRBC 07/16/2019 0.0  0.0 - 0.2 % Final  . Neutrophils Relative % 07/16/2019 51  % Final  . Neutro Abs 07/16/2019 4.1  1.7 - 7.7 K/uL Final  . Lymphocytes Relative 07/16/2019 37  % Final  . Lymphs Abs 07/16/2019 3.0  0.7 - 4.0 K/uL Final  . Monocytes Relative 07/16/2019 9  % Final  . Monocytes Absolute 07/16/2019 0.7  0.1 - 1.0 K/uL Final  . Eosinophils Relative 07/16/2019 2  % Final  .  Eosinophils Absolute 07/16/2019 0.1  0.0 - 0.5 K/uL Final  . Basophils Relative 07/16/2019 1  % Final  . Basophils Absolute 07/16/2019 0.1  0.0 - 0.1 K/uL Final  . Immature Granulocytes 07/16/2019 0  % Final  . Abs Immature Granulocytes 07/16/2019 0.01  0.00 -  0.07 K/uL Final   Performed at [facility], [place] [address] [place], [place] [address]  . Sodium 07/16/2019 138  135 - 145 mmol/L Final  . Potassium 07/16/2019 4.3  3.5 - 5.1 mmol/L Final  . Chloride 07/16/2019 103  98 - 111 mmol/L Final  . CO2 07/16/2019 25  22 - 32 mmol/L Final  . Glucose, Bld 07/16/2019 96  70 - 99 mg/dL Final  . BUN 07/16/2019 8  8 - 23 mg/dL Final  . Creatinine, Ser 07/16/2019 0.76  0.44 - 1.00 mg/dL Final  . Calcium 07/16/2019 9.7  8.9 - 10.3 mg/dL Final  . Total Protein 07/16/2019 7.1  6.5 - 8.1 g/dL Final  . Albumin 07/16/2019 4.5  3.5 - 5.0 g/dL Final  . AST 07/16/2019 15  15 - 41 U/L Final  . ALT 07/16/2019 14  0 - 44 U/L Final  . Alkaline Phosphatase 07/16/2019 54  38 - 126 U/L Final  . Total Bilirubin 07/16/2019 0.8  0.3 - 1.2 mg/dL Final  . GFR calc non Af Amer 07/16/2019 >60  >60 mL/min Final  . GFR calc Af Amer 07/16/2019 >60  >60 mL/min Final  . Anion gap 07/16/2019 10  5 - 15 Final   Performed at [facility], [place] [address] [place],  [address]    (this displays the last labs from the last 3 days)  No results found for: TOTALPROTELP, ALBUMINELP, A1GS, A2GS, BETS, BETA2SER, GAMS, MSPIKE, SPEI (this displays SPEP labs)  No results found for: KPAFRELGTCHN, LAMBDASER, KAPLAMBRATIO (kappa/lambda light chains)  No results found for: HGBA, HGBA2QUANT, HGBFQUANT, HGBSQUAN (Hemoglobinopathy evaluation)   No results found for: LDH  No results found for: IRON, TIBC, IRONPCTSAT (Iron and TIBC)  No results found for: FERRITIN  Urinalysis No results found for: COLORURINE, APPEARANCEUR, LABSPEC, PHURINE, GLUCOSEU, HGBUR, BILIRUBINUR, KETONESUR, PROTEINUR, UROBILINOGEN, NITRITE,  LEUKOCYTESUR   STUDIES: No results found.  ELIGIBLE FOR AVAILABLE RESEARCH PROTOCOL: no  ASSESSMENT: 21 y.Whitney. Milus Glazier, [place] woman status post right breast upper inner quadrant biopsy 06/09/2019 for a clinical T1b NX, stage IA invasive ductal carcinoma, grade 3, estrogen and progesterone receptor positive, HER-2 nonamplified, with an MRD-1 of 10%.  (1) s/p right breaset lumpectomy on 07/20/2019 that showed IDC grade III, 1.3cm, margins negative.    (a) a total of 5 SLN were biopsied  (2) Oncotype 24/10%-chemotherapy not indicated  (3) adjuvant radiation completed on 09/17/2019  (4) antiestrogen therapy with Anastrozole beginning in 09/2019;  (a) bone density on 08/12/2019; osteopenia T score of -1.5 in right femoral neck  (5) genetics testing on 06/23/2019: negative.  Genes tested: ATM, BRCA1, BRCA2, CDH1, CHEK2, PALB2, PTEN, STK11 and TP53.  APC, ATM, AXIN2, BARD1, BMPR1A, BRCA1, BRCA2, BRIP1, CDH1, CDKN2A (p14ARF), CDKN2A (p16INK4a), CKD4, CHEK2, CTNNA1, DICER1, EPCAM (Deletion/duplication testing only), GREM1 (promoter region deletion/duplication testing only), KIT, MEN1, MLH1, MSH2, MSH3, MSH6, MUTYH, NBN, NF1, NHTL1, PALB2, PDGFRA, PMS2, POLD1, POLE, PTEN, RAD50, RAD51C, RAD51D, RNF43, SDHB, SDHC, SDHD, SMAD4, SMARCA4. STK11, TP53, TSC1, TSC2, and VHL.  The following genes were evaluated for sequence changes only: SDHA and HOXB13 c.251G>A variant only.   PLAN:  Tiane has completed her adjuvant radiation therapy and tolerated it moderately well.  I recommended that she continue to apply the cream to her breast BID until she runs out and then suggested her apply vitamin e oil or aquaphor BID after that for the next couple of months.  It is now time for Tiwanna to start anti estrogen  therapy in order to reduce her risk of breast cancer recurrence.  She and I reviewed Tamoxifen and Anastrozole and discussed mechanism of action, and adverse effects of each.  We reviewed her bone density testing  as well.  After discussion, she opted for Anastrozole daily.  She was given information on Anastrozole, as well as on bone health.  We talked in detail about calcium, vitamin d and weight bearing exercises today.    We talked about Aolani exercising.  She was recommended to walk regularly, and if she is going to lift weights to start at a low weight such as 2 lbs and increase her weights slowly.  I recommended she avoid push ups and triceps dips until she has worked her weights up to 10 pounds each.  She understands this.    Atoya will return in 3 months for labs and f/u with Dr. Jana Hakim.  She was recommended to continue with the appropriate pandemic precautions. She knows to call for any questions that may arise between now and her next appointment.  We are happy to see her sooner if needed.  A total of (30) minutes of face-to-face time was spent with this patient with greater than 50% of that time in counseling and care-coordination.   Wilber Bihari, NP   09/21/2019 12:18 PM Medical Oncology and Hematology [facility] [address] [place], [place] [address] Tel. [phone]    Fax. [phone]

## 2019-09-21 ENCOUNTER — Encounter: Payer: Self-pay | Admitting: Adult Health

## 2019-09-21 ENCOUNTER — Other Ambulatory Visit: Payer: Self-pay

## 2019-09-21 ENCOUNTER — Telehealth: Payer: Self-pay | Admitting: Oncology

## 2019-09-21 ENCOUNTER — Ambulatory Visit: Payer: Medicare Other | Admitting: Adult Health

## 2019-09-21 ENCOUNTER — Inpatient Hospital Stay: Payer: Medicare Other | Attending: Oncology | Admitting: Adult Health

## 2019-09-21 VITALS — BP 128/80 | HR 85 | Temp 98.2°F | Resp 17 | Ht 64.0 in | Wt 155.1 lb

## 2019-09-21 DIAGNOSIS — Z170 Estrogen receptor positive status [ER+]: Secondary | ICD-10-CM | POA: Diagnosis not present

## 2019-09-21 DIAGNOSIS — C50211 Malignant neoplasm of upper-inner quadrant of right female breast: Secondary | ICD-10-CM | POA: Insufficient documentation

## 2019-09-21 DIAGNOSIS — M8588 Other specified disorders of bone density and structure, other site: Secondary | ICD-10-CM | POA: Insufficient documentation

## 2019-09-21 DIAGNOSIS — Z923 Personal history of irradiation: Secondary | ICD-10-CM | POA: Insufficient documentation

## 2019-09-21 MED ORDER — ANASTROZOLE 1 MG PO TABS: 1.0000 mg | ORAL_TABLET | Freq: Every day | ORAL | 2 refills | Status: DC

## 2019-09-21 NOTE — Telephone Encounter (Signed)
I talk with patient regarding schedule  

## 2019-09-21 NOTE — Patient Instructions (Signed)
Bone Health Bones protect organs, store calcium, anchor muscles, and support the whole body. Keeping your bones strong is important, especially as you get older. You can take actions to help keep your bones strong and healthy. Why is keeping my bones healthy important?  Keeping your bones healthy is important because your body constantly replaces bone cells. Cells get old, and new cells take their place. As we age, we lose bone cells because the body may not be able to make enough new cells to replace the old cells. The amount of bone cells and bone tissue you have is referred to as bone mass. The higher your bone mass, the stronger your bones. The aging process leads to an overall loss of bone mass in the body, which can increase the likelihood of:  Joint pain and stiffness.  Broken bones.  A condition in which the bones become weak and brittle (osteoporosis). A large decline in bone mass occurs in older adults. In women, it occurs about the time of menopause. What actions can I take to keep my bones healthy? Good health habits are important for maintaining healthy bones. This includes eating nutritious foods and exercising regularly. To have healthy bones, you need to get enough of the right minerals and vitamins. Most nutrition experts recommend getting these nutrients from the foods that you eat. In some cases, taking supplements may also be recommended. Doing certain types of exercise is also important for bone health. What are the nutritional recommendations for healthy bones?  Eating a well-balanced diet with plenty of calcium and vitamin D will help to protect your bones. Nutritional recommendations vary from person to person. Ask your health care provider what is healthy for you. Here are some general guidelines. Get enough calcium Calcium is the most important (essential) mineral for bone health. Most people can get enough calcium from their diet, but supplements may be recommended for  people who are at risk for osteoporosis. Good sources of calcium include:  Dairy products, such as low-fat or nonfat milk, cheese, and yogurt.  Dark green leafy vegetables, such as bok choy and broccoli.  Calcium-fortified foods, such as orange juice, cereal, bread, soy beverages, and tofu products.  Nuts, such as almonds. Follow these recommended amounts for daily calcium intake:  Children, age 20-3: 700 mg.  Children, age 65-8: 1,000 mg.  Children, age 66-13: 1,300 mg.  Teens, age 39-18: 1,300 mg.  Adults, age 71-50: 1,000 mg.  Adults, age 83-70: ? Men: 1,000 mg. ? Women: 1,200 mg.  Adults, age 24 or older: 1,200 mg.  Pregnant and breastfeeding females: ? Teens: 1,300 mg. ? Adults: 1,000 mg. Get enough vitamin D Vitamin D is the most essential vitamin for bone health. It helps the body absorb calcium. Sunlight stimulates the skin to make vitamin D, so be sure to get enough sunlight. If you live in a cold climate or you do not get outside often, your health care provider may recommend that you take vitamin D supplements. Good sources of vitamin D in your diet include:  Egg yolks.  Saltwater fish.  Milk and cereal fortified with vitamin D. Follow these recommended amounts for daily vitamin D intake:  Children and teens, age 20-18: 600 international units.  Adults, age 75 or younger: 400-800 international units.  Adults, age 21 or older: 800-1,000 international units. Get other important nutrients Other nutrients that are important for bone health include:  Phosphorus. This mineral is found in meat, poultry, dairy foods, nuts, and legumes. The  recommended daily intake for adult men and adult women is 700 mg.  Magnesium. This mineral is found in seeds, nuts, dark green vegetables, and legumes. The recommended daily intake for adult men is 400-420 mg. For adult women, it is 310-320 mg.  Vitamin K. This vitamin is found in green leafy vegetables. The recommended daily  intake is 120 mg for adult men and 90 mg for adult women. What type of physical activity is best for building and maintaining healthy bones? Weight-bearing and strength-building activities are important for building and maintaining healthy bones. Weight-bearing activities cause muscles and bones to work against gravity. Strength-building activities increase the strength of the muscles that support bones. Weight-bearing and muscle-building activities include:  Walking and hiking.  Jogging and running.  Dancing.  Gym exercises.  Lifting weights.  Tennis and racquetball.  Climbing stairs.  Aerobics. Adults should get at least 30 minutes of moderate physical activity on most days. Children should get at least 60 minutes of moderate physical activity on most days. Ask your health care provider what type of exercise is best for you. How can I find out if my bone mass is low? Bone mass can be measured with an X-ray test called a bone mineral density (BMD) test. This test is recommended for all women who are age 67 or older. It may also be recommended for:  Men who are age 30 or older.  People who are at risk for osteoporosis because of: ? Having bones that break easily. ? Having a long-term disease that weakens bones, such as kidney disease or rheumatoid arthritis. ? Having menopause earlier than normal. ? Taking medicine that weakens bones, such as steroids, thyroid hormones, or hormone treatment for breast cancer or prostate cancer. ? Smoking. ? Drinking three or more alcoholic drinks a day. If you find that you have a low bone mass, you may be able to prevent osteoporosis or further bone loss by changing your diet and lifestyle. Where can I find more information? For more information, check out the following websites:  [place]: [employer]fr  [employer]: www.bones.SouthExposed.es  International Osteoporosis Foundation:  Administrator.iofbonehealth.org Summary  The aging process leads to an overall loss of bone mass in the body, which can increase the likelihood of broken bones and osteoporosis.  Eating a well-balanced diet with plenty of calcium and vitamin D will help to protect your bones.  Weight-bearing and strength-building activities are also important for building and maintaining strong bones.  Bone mass can be measured with an X-ray test called a bone mineral density (BMD) test. This information is not intended to replace advice given to you by your health care provider. Make sure you discuss any questions you have with your health care provider. Document Released: 01/25/2004 Document Revised: 12/01/2017 Document Reviewed: 12/01/2017 Elsevier Patient Education  [place]. Anastrozole tablets What is this medicine? ANASTROZOLE (an AS troe zole) is used to treat breast cancer in women who have gone through menopause. Some types of breast cancer depend on estrogen to grow, and this medicine can stop tumor growth by blocking estrogen production. This medicine may be used for other purposes; ask your health care provider or pharmacist if you have questions. COMMON BRAND NAME(S): Arimidex What should I tell my health care provider before I take this medicine? They need to know if you have any of these conditions:  bone problems  heart disease  high cholesterol  an unusual or allergic reaction to anastrozole, other medicines, foods,  dyes, or preservatives  pregnant or trying to get pregnant  breast-feeding How should I use this medicine? Take this medicine by mouth with a glass of water. Follow the directions on the prescription label. You can take it with or without food. If it upsets your stomach, take it with food. Take your medicine at regular intervals. Do not take it more often than directed. Do not stop taking except on your doctor's advice. Talk to your pediatrician regarding the use of  this medicine in children. Special care may be needed. Overdosage: If you think you have taken too much of this medicine contact a poison control center or emergency room at once. NOTE: This medicine is only for you. Do not share this medicine with others. What if I miss a dose? If you miss a dose, take it as soon as you can. If it is almost time for your next dose, take only that dose. Do not take double or extra doses. What may interact with this medicine? This medicine may interact with the following medications:  female hormones, like estrogens or progestins and birth control pills, patches, rings, or injections  tamoxifen This list may not describe all possible interactions. Give your health care provider a list of all the medicines, herbs, non-prescription drugs, or dietary supplements you use. Also tell them if you smoke, drink alcohol, or use illegal drugs. Some items may interact with your medicine. What should I watch for while using this medicine? Visit your doctor or health care professional for regular checks on your progress. Let your doctor or health care professional know about any unusual vaginal bleeding. Do not become pregnant while taking this medicine or for at least 3 weeks after stopping it. Women should inform their doctor if they wish to become pregnant or think they might be pregnant. There is a potential for serious side effects to an unborn child. Talk to your health care professional or pharmacist for more information. Do not breast-feed an infant while taking this medicine or for 2 weeks after stopping it. This medicine may interfere with the ability to have a child. Talk with your doctor or health care professional if you are concerned about your fertility. Using this medicine for a long time may increase your risk of low bone mass. Talk to your doctor about bone health. You should make sure that you get enough calcium and vitamin D while you are taking this medicine.  Discuss the foods you eat and the vitamins you take with your health care professional. What side effects may I notice from receiving this medicine? Side effects that you should report to your doctor or health care professional as soon as possible:  allergic reactions like skin rash, itching or hives, swelling of the face, lips, or tongue  signs and symptoms of a blood clot such as breathing problems; changes in vision; chest pain; sudden headache; pain, swelling, warmth in the leg; trouble speaking; sudden numbness or weakness of the face, arm, or leg  signs and symptoms of infection like fever or chills; cough; sore throat; pain or trouble passing urine Side effects that usually do not require medical attention (report to your doctor or health care professional if they continue or are bothersome):  bone pain  dizziness  hair loss  headache  hot flashes  joint pain  muscle pain  signs of decreased red blood cells - unusually weak or tired, feeling faint or lightheaded, falls  vaginal discharge, itching, or odor in women This  list may not describe all possible side effects. Call your doctor for medical advice about side effects. You may report side effects to FDA at 1-800-FDA-1088. Where should I keep my medicine? Keep out of the reach of children. Store at room temperature between 20 and 25 degrees C (68 and 77 degrees F). Throw away any unused medicine after the expiration date. NOTE: This sheet is a summary. It may not cover all possible information. If you have questions about this medicine, talk to your doctor, pharmacist, or health care provider.  2020 Elsevier/Gold Standard (2017-11-17 14:56:51)

## 2019-10-07 NOTE — Progress Notes (Signed)
  Radiation Oncology         [phone]) [phone] ________________________________  Name: Donna Whitney MRN: [id number]  Date: 09/17/2019  DOB: [birth year]  End of Treatment Note  Diagnosis:   right-sided breast cancer     Indication for treatment:  Curative       Radiation treatment dates:   08/23/19 - 09/17/19  Site/dose:   The patient initially received a dose of 42.56 Gy in 16 fractions to the breast using whole-breast tangent fields. This was delivered using a 3-D conformal technique. The patient then received a boost to the seroma. This delivered an additional 10 Gy in 28fractions using an en face electron field due to the depth of the seroma. The total dose was 52.56Gy.  Narrative: The patient tolerated radiation treatment relatively well.   The patient had some expected skin irritation as she progressed during treatment. Moist desquamation was not present at the end of treatment.  Plan: The patient has completed radiation treatment. The patient will return to radiation oncology clinic for routine followup in one month. I advised the patient to call or return sooner if they have any questions or concerns related to their recovery or treatment. ________________________________  Jodelle Gross, M.D., Ph.D.

## 2019-10-25 ENCOUNTER — Telehealth: Payer: Self-pay | Admitting: Radiation Oncology

## 2019-10-25 NOTE — Telephone Encounter (Signed)
  Radiation Oncology         [phone]) [phone] ________________________________  Name: Donna Whitney MRN: [id number]  Date of Service: 10/25/2019  DOB: 06-09-1956  Post Treatment Telephone Note  Diagnosis:   Stage IA, pT1cN0M0 grade 3, ER/PR positive invasive ductal carcinoma of the right breast  Interval Since Last Radiation:  6 weeks   08/23/19 - 09/17/19:  The patient initially received a dose of 42.56 Gy in 16 fractions to the right breast using whole-breast tangent fields. This was delivered using a 3-D conformal technique. The patient then received a boost to the seroma. This delivered an additional 10 Gy in 9fractions using an en face electron field due to the depth of the seroma. The total dose was 52.56Gy.  Narrative:  The patient was contacted today for routine follow-up. During treatment she did very well with radiotherapy and did not have significant desquamation. She reports she is doing great and her skin is doing well. She's also tolerating anastrozole.   Impression/Plan: 1.  Stage IA, pT1cN0M0 grade 3, ER/PR positive invasive ductal carcinoma of the right breast. The patient has been doing well since completion of radiotherapy. We discussed that we would be happy to continue to follow her as needed, but she will also continue to follow up with Dr. Jana Hakim in medical oncology. She was counseled on skin care as well as measures to avoid sun exposure to this area.  2. Survivorship. We discussed the importance of survivorship evaluation.     Carola Rhine, PAC

## 2019-11-04 ENCOUNTER — Telehealth: Payer: Self-pay

## 2019-11-04 NOTE — Telephone Encounter (Signed)
Pt called to report that she has been experiencing symptoms of returning ulcerative colitis - mucoid stool, blood in stool, abdominal pain and bloating. She states she has not had any symptoms for several years.  Symptoms returned approx 2 wks ago, after she started taking anastrozole.  She states she stopped the anastrozole on Sunday (12/13).  Since then no more mucoid stool or blood but still having pain and bloating. Per Dr Jana Hakim pt should continue to hold anastrozole until symptoms completely resolve.  At that point she should let us know symptoms have resolved and we will restart anastrozole to see if symptoms return.  If they do then Dr Jana Hakim will switch her to a different medication. Pt verbalizes understanding of plan and knows to call for any further questions or concerns.

## 2019-11-16 ENCOUNTER — Encounter: Payer: Self-pay | Admitting: *Deleted

## 2019-11-23 ENCOUNTER — Other Ambulatory Visit: Payer: Self-pay | Admitting: Adult Health

## 2019-11-23 DIAGNOSIS — Z170 Estrogen receptor positive status [ER+]: Secondary | ICD-10-CM

## 2019-11-23 DIAGNOSIS — C50211 Malignant neoplasm of upper-inner quadrant of right female breast: Secondary | ICD-10-CM

## 2019-11-25 DIAGNOSIS — K5790 Diverticulosis of intestine, part unspecified, without perforation or abscess without bleeding: Secondary | ICD-10-CM | POA: Insufficient documentation

## 2019-12-21 NOTE — Progress Notes (Signed)
[place]  [phone] [phone]     ID: Donna Whitney DOB: 11/27/1955  MR#: [id number]  HOZ#:[id number]  Patient Care Team: Asencion Noble, MD as PCP - General (Internal Medicine) Mauro Kaufmann, RN as Oncology Nurse Navigator Rockwell Germany, RN as Oncology Nurse Navigator Erroll Luna, MD as Consulting Physician (General Surgery) Elfrieda Espino, Virgie Dad, MD as Consulting Physician (Oncology) Kyung Rudd, MD as Consulting Physician (Radiation Oncology) Linda Hedges, DO as Consulting Physician (Obstetrics and Gynecology) Richmond Campbell, MD as Consulting Physician (Gastroenterology) Chauncey Cruel, MD OTHER MD:  CHIEF COMPLAINT: estrogen receptor positive breast cancer  CURRENT TREATMENT: anastrozole   INTERVAL HISTORY: Donna Whitney is here today for follow up of her estrogen positive breast cancer.    She was started on anastrozole at her last visit on 09/21/2019.  Her most recent bone density testing on 08/12/2019 showed a T-score of -1.5, which is considered osteopenic.  Since her last visit, she underwent colonoscopy on 11/25/2019 under Dr. Earlean Shawl. The exam was normal, and she was recommended to return for repeat in 5 years.   REVIEW OF SYSTEMS: Donna Whitney is tolerating anastrozole generally well.  She has had a little bit of ankle swelling towards the end of the day which she thinks might be possibly related.  She also had some rectal mucus.  Recall she does have a history of IBS.  She recently had a colonoscopy which was unremarkable.  She stopped anastrozole and the mucus problems seem to improve.  She restarted then and the mucus problem may have become a little bit more noticeable.  She is not having arthralgias or myalgias.  She is working full term at her job and actually doing her husband's job as well since he recently had rotator cuff surgery.  A detailed review of systems was otherwise noncontributory   HISTORY OF CURRENT ILLNESS:  From the original intake note:  Donna Whitney presented with a 2 week history of a palpable area in the upper-inner quadrant of her right breast. She underwent bilateral diagnostic mammography with tomography and right breast ultrasonography at [facility] on 06/03/2019 showing: breast density category B; 7 mm lobulated region in the upper-inner right breast suspicious of malignancy.  Evaluation of the right axilla if performed was not documented in the ultrasound report.  Accordingly on 06/09/2019 she proceeded to biopsy of the right breast area in question. The pathology from this procedure ([id number]) showed: invasive ductal carcinoma, grade 3. Prognostic indicators significant for: estrogen receptor, 100% positive with strong staining intensity, and progesterone receptor, 30% positive, with weak staining intensity. Proliferation marker Ki67 at 10%. HER2 equivocal by immunohistochemistry (2+), but negative by fluorescent in situ hybridization. Two sets of cells were evaluated showing a signals ratio 1.63/1.62 and number per cell 4.25/4.5.  The patient's subsequent history is as detailed below.   PAST MEDICAL HISTORY: Past Medical History:  Diagnosis Date  . Anxiety   . Cancer ([place]) 05/2019   right breast cancer  . Complication of anesthesia    trouble waking up  . Family history of breast cancer   . Family history of lung cancer   . GERD (gastroesophageal reflux disease)   . Hypertension   . IBS (irritable bowel syndrome)   . PONV (postoperative nausea and vomiting)   . Ulcerative colitis ([place])     PAST SURGICAL HISTORY: Past Surgical History:  Procedure Laterality Date  . BREAST LUMPECTOMY WITH RADIOACTIVE SEED AND SENTINEL LYMPH NODE BIOPSY Right 07/20/2019   Procedure:  RIGHT BREAST LUMPECTOMY WITH RADIOACTIVE SEED AND RIGHT SENTINEL LYMPH NODE MAPPING;  Surgeon: Erroll Luna, MD;  Location: [facility];  Service: General;  Laterality: Right;  PEC BLOCK  . CESAREAN  SECTION    . TONSILLECTOMY     as a child  . TUBAL LIGATION      FAMILY HISTORY: Family History  Problem Relation Age of Onset  . Lung cancer Maternal Grandmother   . Breast cancer Paternal Aunt        dx 39 or older   Patient's father was 62 years old when he died from MRSA following back surgery, complicated by heavy smoking and a prior heart attack. Patient's mother died from heart attack at age 97. The patient denies a family hx of ovarian cancer. She reports a paternal aunt with breast cancer. She has 3 siblings, 1 brother and 2 sisters. She also notes lung cancer in her maternal grandmother.   GYNECOLOGIC HISTORY:  No LMP recorded. Patient is postmenopausal. Menarche: 64 years old Age at first live birth: 64 years old [place] P 2 LMP 11/2013 Contraceptive: yes, stopped after 6 months due to elevated blood pressure HRT no  Hysterectomy? no BSO? no   SOCIAL HISTORY: (updated 06/16/2019)  Donna Whitney is currently working as a Programme researcher, broadcasting/film/video. She is married. Husband Donna Whitney is her partner in this. 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 She lives at home with her husband. Son Donna Whitney, age 48, lives in [place] as a Development worker, international aid. Daughter Donna Whitney, age 33, lives in [place] as a Training and development officer for [employer]. She has two grandchildren. She attends [employer].    ADVANCED DIRECTIVES: Husband Donna Whitney is her HCPOA.   HEALTH MAINTENANCE: Social History   Tobacco Use  . Smoking status: Former Research scientist (life sciences)  . Smokeless tobacco: Never Used  Substance Use Topics  . Alcohol use: Yes    Comment: social  . Drug use: Never     Colonoscopy: 11/2019 (Dr. Earlean Shawl), repeat 2026  PAP: 01/2018  Bone density: 07/2019, -1.5 (at [facility])   No Known Allergies  Current Outpatient Medications  Medication Sig Dispense Refill  . anastrozole (ARIMIDEX) 1 MG tablet TAKE 1 TABLET BY MOUTH ONCE DAILY. 30 tablet 0  . famotidine (PEPCID) 20 MG tablet Take  20 mg by mouth daily. Pt only takes as needed    . methocarbamol (ROBAXIN) 500 MG tablet Take 500 mg by mouth every 6 (six) hours as needed for muscle spasms.    . NON FORMULARY Take 1 tablet by mouth daily. [place] High Potency Probiotic    . olmesartan (BENICAR) 20 MG tablet Take 20 mg by mouth daily.    Marland Kitchen Zoster Vaccine Adjuvanted [facility]) injection Shingrix (PF) 50 mcg/0.5 mL intramuscular suspension, kit     No current facility-administered medications for this visit.    OBJECTIVE: Middle-aged white woman in no acute distress  Vitals:   12/22/19 1014  BP: (!) 145/77  Pulse: 87  Resp: 18  Temp: (!) 97.1 F (36.2 C)  SpO2: 98%     Body mass index is 26.38 kg/m.   Wt Readings from Last 3 Encounters:  12/22/19 153 lb 11.2 oz (69.7 kg)  09/21/19 155 lb 1.6 oz (70.4 kg)  08/09/19 151 lb 1.6 oz (68.5 kg)  ECOG FS:1 - Symptomatic but completely ambulatory  Sclerae unicteric, EOMs intact Wearing a mask No cervical or supraclavicular adenopathy Lungs no rales or rhonchi Heart regular rate and  rhythm Abd soft, nontender, positive bowel sounds MSK no focal spinal tenderness, no upper extremity lymphedema Neuro: nonfocal, well oriented, appropriate affect Breasts: The right breast is status post lumpectomy and radiation.  There is no evidence of local recurrence.  Left breast is benign.  Both axillae are benign.   LAB RESULTS:  CMP     Component Value Date/Time   NA 138 07/16/2019 1211   K 4.3 07/16/2019 1211   CL 103 07/16/2019 1211   CO2 25 07/16/2019 1211   GLUCOSE 96 07/16/2019 1211   BUN 8 07/16/2019 1211   CREATININE 0.76 07/16/2019 1211   CREATININE 0.80 06/16/2019 1218   CALCIUM 9.7 07/16/2019 1211   PROT 7.1 07/16/2019 1211   ALBUMIN 4.5 07/16/2019 1211   AST 15 07/16/2019 1211   AST 15 06/16/2019 1218   ALT 14 07/16/2019 1211   ALT 14 06/16/2019 1218   ALKPHOS 54 07/16/2019 1211   BILITOT 0.8 07/16/2019 1211   BILITOT 0.8 06/16/2019 1218   GFRNONAA  >60 07/16/2019 1211   GFRNONAA >60 06/16/2019 1218   GFRAA >60 07/16/2019 1211   GFRAA >60 06/16/2019 1218    No results found for: TOTALPROTELP, ALBUMINELP, A1GS, A2GS, BETS, BETA2SER, GAMS, MSPIKE, SPEI  No results found for: KPAFRELGTCHN, LAMBDASER, KAPLAMBRATIO  Lab Results  Component Value Date   WBC 6.1 12/22/2019   NEUTROABS 4.3 12/22/2019   HGB 12.7 12/22/2019   HCT 37.9 12/22/2019   MCV 87.7 12/22/2019   PLT 219 12/22/2019   No results found for: LABCA2  No components found for: PHXTAV697  No results for input(s): INR in the last 168 hours.  No results found for: LABCA2  No results found for: XYI016  No results found for: PVV748  No results found for: OLM786  No results found for: CA2729  No components found for: HGQUANT  No results found for: CEA1 / No results found for: CEA1   No results found for: AFPTUMOR  No results found for: CHROMOGRNA  No results found for: HGBA, HGBA2QUANT, HGBFQUANT, HGBSQUAN (Hemoglobinopathy evaluation)   No results found for: LDH  No results found for: IRON, TIBC, IRONPCTSAT (Iron and TIBC)  No results found for: FERRITIN  Urinalysis No results found for: COLORURINE, APPEARANCEUR, LABSPEC, PHURINE, GLUCOSEU, HGBUR, BILIRUBINUR, KETONESUR, PROTEINUR, UROBILINOGEN, NITRITE, LEUKOCYTESUR   STUDIES: No results found.   ELIGIBLE FOR AVAILABLE RESEARCH PROTOCOL: no  ASSESSMENT: 64 y.o. [place]us Glazier, [place] woman status post right breast upper inner quadrant biopsy 06/09/2019 for a clinical T1b NX, stage IA invasive ductal carcinoma, grade 3, estrogen and progesterone receptor positive, HER-2 nonamplified, with an MRD-1 of 10%.  (1) s/p right breaset lumpectomy on 07/20/2019 that showed IDC grade III, 1.3cm, margins negative.    (a) a total of 5 SLN were biopsied  (2) Oncotype 24, predicting a risk of recurrence outside the breast over the next 9 years of10% if the patient's only systemic therapy is antiestrogen's for 5  years.  It also predicts no benefit from chemotherapy.-  (3) adjuvant radiation 08/23/2019 - 09/17/2019  (a) right breast / 42.56 Gy in 16 fractions  (b) boost / 10 Gy in 4 fractions  (4) anastrozole beginning in 09/2019;  (a) bone density on 08/12/2019; osteopenia T score of -1.5   (5) genetics testing on 06/23/2019 through thehe [facility] Common Hereditary Cancers Panel found no deleterious mutations in ATM, BRCA1, BRCA2, CDH1, CHEK2, PALB2, PTEN, STK11 and TP53.  APC, ATM, AXIN2, BARD1, BMPR1A, BRCA1, BRCA2, BRIP1, CDH1, CDKN2A (p14ARF), CDKN2A (p16INK4a),  CKD4, CHEK2, CTNNA1, DICER1, EPCAM (Deletion/duplication testing only), GREM1 (promoter region deletion/duplication testing only), KIT, MEN1, MLH1, MSH2, MSH3, MSH6, MUTYH, NBN, NF1, NHTL1, PALB2, PDGFRA, PMS2, POLD1, POLE, PTEN, RAD50, RAD51C, RAD51D, RNF43, SDHB, SDHC, SDHD, SMAD4, SMARCA4. STK11, TP53, TSC1, TSC2, and VHL.  The following genes were evaluated for sequence changes only: SDHA and HOXB13 c.251G>A variant only.    PLAN: Briyana has recovered very nicely from her local treatment with surgery and radiation and I believe she is tolerating anastrozole well.  I do not think the ankle swelling or rectal mucus is going to be related to this medication.  I did recommend she consider compression stockings for the ankle swelling issue.  I gave her information on our pelvic health program regarding vaginal dryness problems which are very common on aromatase inhibitors.  Of course this was already present prior to the start of the medication  She has some questions regarding supplements.  A generally discourage them because we do not have any data not only regarding whether they are helpful or not but whether they are harmful or interfere with treatment.  The one thing that I did say is that she should not take anything that includes estrogen as that would counteract the effect of the anastrozole.  I am going to see her again in September  chiefly to make sure that she continues to tolerate treatment well.  If not of course we have other antiestrogen options.  If all is going well in September I will start seeing her on a once a year basis.  Total encounter time 20 minutes.Sarajane Jews C. Jaylia Pettus, MD   12/22/2019 10:38 AM Medical Oncology and Hematology [facility] [place], [place] [address] Tel. [phone]    Fax. [phone]   I, Wilburn Mylar, am acting as scribe for Dr. Virgie Dad. Desmin Daleo.  I, Lurline Del MD, have reviewed the above documentation for accuracy and completeness, and I agree with the above.    *Total Encounter Time as defined by the Centers for Medicare and Medicaid Services includes, in addition to the face-to-face time of a patient visit (documented in the note above) non-face-to-face time: obtaining and reviewing outside history, ordering and reviewing medications, tests or procedures, care coordination (communications with other health care professionals or caregivers) and documentation in the medical record.

## 2019-12-22 ENCOUNTER — Other Ambulatory Visit: Payer: Self-pay

## 2019-12-22 ENCOUNTER — Inpatient Hospital Stay: Payer: Medicare Other | Attending: Oncology | Admitting: Oncology

## 2019-12-22 ENCOUNTER — Inpatient Hospital Stay: Payer: Medicare Other

## 2019-12-22 VITALS — BP 145/77 | HR 87 | Temp 97.1°F | Resp 18 | Ht 64.0 in | Wt 153.7 lb

## 2019-12-22 DIAGNOSIS — M8580 Other specified disorders of bone density and structure, unspecified site: Secondary | ICD-10-CM | POA: Diagnosis not present

## 2019-12-22 DIAGNOSIS — M25473 Effusion, unspecified ankle: Secondary | ICD-10-CM | POA: Diagnosis not present

## 2019-12-22 DIAGNOSIS — K6289 Other specified diseases of anus and rectum: Secondary | ICD-10-CM | POA: Diagnosis not present

## 2019-12-22 DIAGNOSIS — Z923 Personal history of irradiation: Secondary | ICD-10-CM | POA: Diagnosis not present

## 2019-12-22 DIAGNOSIS — Z170 Estrogen receptor positive status [ER+]: Secondary | ICD-10-CM

## 2019-12-22 DIAGNOSIS — C50211 Malignant neoplasm of upper-inner quadrant of right female breast: Secondary | ICD-10-CM | POA: Diagnosis present

## 2019-12-22 DIAGNOSIS — Z87891 Personal history of nicotine dependence: Secondary | ICD-10-CM | POA: Insufficient documentation

## 2019-12-22 LAB — CBC WITH DIFFERENTIAL/PLATELET
Abs Immature Granulocytes: 0.02 10*3/uL (ref 0.00–0.07)
Basophils Absolute: 0 10*3/uL (ref 0.0–0.1)
Basophils Relative: 1 %
Eosinophils Absolute: 0.1 10*3/uL (ref 0.0–0.5)
Eosinophils Relative: 2 %
HCT: 37.9 % (ref 36.0–46.0)
Hemoglobin: 12.7 g/dL (ref 12.0–15.0)
Immature Granulocytes: 0 %
Lymphocytes Relative: 20 %
Lymphs Abs: 1.2 10*3/uL (ref 0.7–4.0)
MCH: 29.4 pg (ref 26.0–34.0)
MCHC: 33.5 g/dL (ref 30.0–36.0)
MCV: 87.7 fL (ref 80.0–100.0)
Monocytes Absolute: 0.4 10*3/uL (ref 0.1–1.0)
Monocytes Relative: 7 %
Neutro Abs: 4.3 10*3/uL (ref 1.7–7.7)
Neutrophils Relative %: 70 %
Platelets: 219 10*3/uL (ref 150–400)
RBC: 4.32 MIL/uL (ref 3.87–5.11)
RDW: 13.1 % (ref 11.5–15.5)
WBC: 6.1 10*3/uL (ref 4.0–10.5)
nRBC: 0 % (ref 0.0–0.2)

## 2019-12-22 LAB — COMPREHENSIVE METABOLIC PANEL
ALT: 12 U/L (ref 0–44)
AST: 14 U/L — ABNORMAL LOW (ref 15–41)
Albumin: 4.3 g/dL (ref 3.5–5.0)
Alkaline Phosphatase: 70 U/L (ref 38–126)
Anion gap: 8 (ref 5–15)
BUN: 13 mg/dL (ref 8–23)
CO2: 27 mmol/L (ref 22–32)
Calcium: 9.3 mg/dL (ref 8.9–10.3)
Chloride: 105 mmol/L (ref 98–111)
Creatinine, Ser: 0.79 mg/dL (ref 0.44–1.00)
GFR calc Af Amer: >60 mL/min (ref >60)
GFR calc non Af Amer: >60 mL/min (ref >60)
Glucose, Bld: 96 mg/dL (ref 70–99)
Potassium: 4.1 mmol/L (ref 3.5–5.1)
Sodium: 140 mmol/L (ref 135–145)
Total Bilirubin: 0.6 mg/dL (ref 0.3–1.2)
Total Protein: 7.1 g/dL (ref 6.5–8.1)

## 2019-12-23 ENCOUNTER — Telehealth: Payer: Self-pay | Admitting: Oncology

## 2019-12-23 NOTE — Telephone Encounter (Signed)
I talk with patient regarding schedule  

## 2020-05-16 ENCOUNTER — Telehealth: Payer: Self-pay | Admitting: *Deleted

## 2020-05-16 NOTE — Telephone Encounter (Signed)
This RN spoke with the pt per her call- stating she has stopped the anastrazole due to side effects.  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  She states she stopped approximately 3 weeks ago - with resolution of symptoms.  This RN informed her - MD is out of the office.  This note will be reviewed with him upon his return.

## 2020-06-08 ENCOUNTER — Other Ambulatory Visit: Payer: Self-pay | Admitting: Oncology

## 2020-06-08 MED ORDER — LETROZOLE 2.5 MG PO TABS: ORAL_TABLET | ORAL | 6 refills | Status: DC

## 2020-06-08 NOTE — Progress Notes (Signed)
Donna Whitney has been unable to tolerate the anastrozole.  She is felt extremely fatigued.  We are going to try letrozole Monday Wednesday Friday.  She will let us know how its going sometime by mid August and of course she has an appointment with me September 3.

## 2020-07-03 ENCOUNTER — Telehealth: Payer: Self-pay

## 2020-07-03 NOTE — Telephone Encounter (Signed)
Pt called stating she has been tolerating Letrozole appropriately other than some mild fatigue. Pt states she would like to remain on regimen MWF as Dr Jana Hakim prescribed at least until she sees him 9/3.

## 2020-07-31 ENCOUNTER — Other Ambulatory Visit: Payer: Medicare Other

## 2020-07-31 ENCOUNTER — Ambulatory Visit: Payer: Medicare Other | Admitting: Oncology

## 2020-08-17 ENCOUNTER — Other Ambulatory Visit: Payer: Medicare Other

## 2020-08-17 ENCOUNTER — Ambulatory Visit: Payer: Medicare Other | Admitting: Oncology

## 2020-09-01 ENCOUNTER — Other Ambulatory Visit: Payer: Self-pay

## 2020-09-01 ENCOUNTER — Inpatient Hospital Stay (HOSPITAL_BASED_OUTPATIENT_CLINIC_OR_DEPARTMENT_OTHER): Payer: Medicare Other | Admitting: Oncology

## 2020-09-01 ENCOUNTER — Inpatient Hospital Stay: Payer: Medicare Other | Attending: Oncology

## 2020-09-01 VITALS — BP 166/83 | HR 78 | Temp 98.5°F | Resp 18 | Ht 64.0 in | Wt 152.8 lb

## 2020-09-01 DIAGNOSIS — C50211 Malignant neoplasm of upper-inner quadrant of right female breast: Secondary | ICD-10-CM

## 2020-09-01 DIAGNOSIS — M8580 Other specified disorders of bone density and structure, unspecified site: Secondary | ICD-10-CM | POA: Diagnosis not present

## 2020-09-01 DIAGNOSIS — Z87891 Personal history of nicotine dependence: Secondary | ICD-10-CM | POA: Insufficient documentation

## 2020-09-01 DIAGNOSIS — R4189 Other symptoms and signs involving cognitive functions and awareness: Secondary | ICD-10-CM | POA: Insufficient documentation

## 2020-09-01 DIAGNOSIS — Z170 Estrogen receptor positive status [ER+]: Secondary | ICD-10-CM | POA: Diagnosis not present

## 2020-09-01 DIAGNOSIS — K219 Gastro-esophageal reflux disease without esophagitis: Secondary | ICD-10-CM | POA: Insufficient documentation

## 2020-09-01 LAB — CBC WITH DIFFERENTIAL/PLATELET
Abs Immature Granulocytes: 0.01 10*3/uL (ref 0.00–0.07)
Basophils Absolute: 0 10*3/uL (ref 0.0–0.1)
Basophils Relative: 1 %
Eosinophils Absolute: 0.1 10*3/uL (ref 0.0–0.5)
Eosinophils Relative: 3 %
HCT: 38.3 % (ref 36.0–46.0)
Hemoglobin: 12.5 g/dL (ref 12.0–15.0)
Immature Granulocytes: 0 %
Lymphocytes Relative: 29 %
Lymphs Abs: 1.7 10*3/uL (ref 0.7–4.0)
MCH: 29.1 pg (ref 26.0–34.0)
MCHC: 32.6 g/dL (ref 30.0–36.0)
MCV: 89.3 fL (ref 80.0–100.0)
Monocytes Absolute: 0.5 10*3/uL (ref 0.1–1.0)
Monocytes Relative: 9 %
Neutro Abs: 3.3 10*3/uL (ref 1.7–7.7)
Neutrophils Relative %: 58 %
Platelets: 211 10*3/uL (ref 150–400)
RBC: 4.29 MIL/uL (ref 3.87–5.11)
RDW: 13.2 % (ref 11.5–15.5)
WBC: 5.7 10*3/uL (ref 4.0–10.5)
nRBC: 0 % (ref 0.0–0.2)

## 2020-09-01 LAB — COMPREHENSIVE METABOLIC PANEL
ALT: 14 U/L (ref 0–44)
AST: 16 U/L (ref 15–41)
Albumin: 4.2 g/dL (ref 3.5–5.0)
Alkaline Phosphatase: 62 U/L (ref 38–126)
Anion gap: 4 — ABNORMAL LOW (ref 5–15)
BUN: 9 mg/dL (ref 8–23)
CO2: 30 mmol/L (ref 22–32)
Calcium: 9.8 mg/dL (ref 8.9–10.3)
Chloride: 105 mmol/L (ref 98–111)
Creatinine, Ser: 0.83 mg/dL (ref 0.44–1.00)
GFR, Estimated: >60 mL/min (ref >60)
Glucose, Bld: 96 mg/dL (ref 70–99)
Potassium: 4.2 mmol/L (ref 3.5–5.1)
Sodium: 139 mmol/L (ref 135–145)
Total Bilirubin: 0.6 mg/dL (ref 0.3–1.2)
Total Protein: 7.3 g/dL (ref 6.5–8.1)

## 2020-09-01 NOTE — Progress Notes (Signed)
[place]  [phone] Fax:([phone]) [phone]     ID: Donna Whitney DOB: 1956/10/22  MR#: [id number]  XNT#:[id number]  Patient Care Team: Asencion Noble, MD as PCP - General (Internal Medicine) Mauro Kaufmann, RN as Oncology Nurse Navigator Rockwell Germany, RN as Oncology Nurse Navigator Erroll Luna, MD as Consulting Physician (General Surgery) Zackarey Holleman, Virgie Dad, MD as Consulting Physician (Oncology) Kyung Rudd, MD as Consulting Physician (Radiation Oncology) Linda Hedges, DO as Consulting Physician (Obstetrics and Gynecology) Richmond Campbell, MD as Consulting Physician (Gastroenterology) Chauncey Cruel, MD OTHER MD:  CHIEF COMPLAINT: estrogen receptor positive breast cancer  CURRENT TREATMENT: Currently under observation   INTERVAL HISTORY: Donna Whitney is here today for follow up of her estrogen positive breast cancer.    Since her last visit, she was switched to letrozole on 06/08/2020 due to extreme fatigue from the anastrozole.  She is not having problems with hot flashes or vaginal dryness due to the letrozole.  Her most recent bone density testing on 08/12/2019 showed a T-score of -1.5, which is considered osteopenic.   REVIEW OF SYSTEMS: Donna Whitney is struggling with the letrozole.  It upsets her stomach.  She has significant reflux problems which are not new but which are worse.  Overall her "brain is not working".  She lives in a mental fog.  She cannot multitask.  She does not think she can continue on this medication.  A detailed review of systems today was otherwise stable   HISTORY OF CURRENT ILLNESS: From the original intake note:  Donna Whitney presented with a Whitney week history of a palpable area in the upper-inner quadrant of her right breast. She underwent bilateral diagnostic mammography with tomography and right breast ultrasonography at [facility] on 06/03/2019 showing: breast density category B; 7 mm lobulated region in the  upper-inner right breast suspicious of malignancy.  Evaluation of the right axilla if performed was not documented in the ultrasound report.  Accordingly on 06/09/2019 she proceeded to biopsy of the right breast area in question. The pathology from this procedure ([id number]) showed: invasive ductal carcinoma, grade 3. Prognostic indicators significant for: estrogen receptor, 100% positive with strong staining intensity, and progesterone receptor, 30% positive, with weak staining intensity. Proliferation marker Ki67 at 10%. HER2 equivocal by immunohistochemistry (Whitney+), but negative by fluorescent in situ hybridization. Two sets of cells were evaluated showing a signals ratio 1.63/1.62 and number per cell 4.25/4.5.  The patient's subsequent history is as detailed below.   PAST MEDICAL HISTORY: Past Medical History:  Diagnosis Date  . Anxiety   . Cancer ([place]) 05/2019   right breast cancer  . Complication of anesthesia    trouble waking up  . Family history of breast cancer   . Family history of lung cancer   . GERD (gastroesophageal reflux disease)   . Hypertension   . IBS (irritable bowel syndrome)   . PONV (postoperative nausea and vomiting)   . Ulcerative colitis ([place])     PAST SURGICAL HISTORY: Past Surgical History:  Procedure Laterality Date  . BREAST LUMPECTOMY WITH RADIOACTIVE SEED AND SENTINEL LYMPH NODE BIOPSY Right 07/20/2019   Procedure: RIGHT BREAST LUMPECTOMY WITH RADIOACTIVE SEED AND RIGHT SENTINEL LYMPH NODE MAPPING;  Surgeon: Erroll Luna, MD;  Location: [place];  Service: General;  Laterality: Right;  PEC BLOCK  . CESAREAN SECTION    . TONSILLECTOMY     as a child  . TUBAL LIGATION      FAMILY HISTORY: Family History  Problem Relation Age of Onset  . Lung cancer Maternal Grandmother   . Breast cancer Paternal Aunt        dx 55 or older   Patient's father was 84 years old when he died from MRSA following back surgery, complicated by heavy  smoking and a prior heart attack. Patient's mother died from heart attack at age 34. The patient denies a family hx of ovarian cancer. She reports a paternal aunt with breast cancer. She has 3 siblings, 1 brother and Whitney sisters. She also notes lung cancer in her maternal grandmother.   GYNECOLOGIC HISTORY:  No LMP recorded. Patient is postmenopausal. Menarche: 64 years old Age at first live birth: 64 years old Donna Whitney LMP 11/2013 Contraceptive: yes, stopped after 6 months due to elevated blood pressure HRT no  Hysterectomy? no BSO? no   SOCIAL HISTORY: (updated 06/16/2019)  Donna Whitney is currently working as a Programme researcher, broadcasting/film/video. She is married. Husband [name] is her partner in this. 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 She lives at home with her husband. Son [name], age 32, lives in [place] as a Development worker, international aid. Daughter [name], age 52, lives in [place] as a Training and development officer for [employer]. She has two grandchildren. She attends [employer].    ADVANCED DIRECTIVES: Husband [name] is her HCPOA.   HEALTH MAINTENANCE: Social History   Tobacco Use  . Smoking status: Former Research scientist (life sciences)  . Smokeless tobacco: Never Used  Substance Use Topics  . Alcohol use: Yes    Comment: social  . Drug use: Never     Colonoscopy: 11/2019 (Dr. Earlean Shawl), repeat 2026  PAP: 01/2018  Bone density: 07/2019, -1.5 (at [facility])   No Known Allergies  Current Outpatient Medications  Medication Sig Dispense Refill  . famotidine (PEPCID) 20 MG tablet Take 20 mg by mouth daily. Pt only takes as needed    . letrozole [facility]) Whitney.5 MG tablet Take one tablet (Whitney.5 mg) every Monday, Weds and Friday 60 tablet 6  . methocarbamol (ROBAXIN) 500 MG tablet Take 500 mg by mouth every 6 (six) hours as needed for muscle spasms.    . NON FORMULARY Take 1 tablet by mouth daily. [place] High Potency Probiotic    . olmesartan (BENICAR) 20 MG tablet Take 20 mg by mouth daily.     Marland Kitchen Zoster Vaccine Adjuvanted [facility]) injection Shingrix (PF) 50 mcg/0.5 mL intramuscular suspension, kit     No current facility-administered medications for this visit.    OBJECTIVE: White woman who appears stated age  78:   09/01/20 1150  BP: (!) 166/83  Pulse: 78  Resp: 18  Temp: 98.5 F (36.9 C)  SpO2: 100%     Body mass index is 26.23 kg/m.   Wt Readings from Last 3 Encounters:  09/01/20 152 lb 12.8 oz (69.3 kg)  12/22/19 153 lb 11.Whitney oz (69.7 kg)  09/21/19 155 lb 1.6 oz (70.4 kg)  ECOG FS:1 - Symptomatic but completely ambulatory  Sclerae unicteric, EOMs intact Wearing a mask No cervical or supraclavicular adenopathy Lungs no rales or rhonchi Heart regular rate and rhythm Abd soft, nontender, positive bowel sounds MSK no focal spinal tenderness, no upper extremity lymphedema Neuro: nonfocal, well oriented, appropriate affect Breasts: The right breast has undergone lumpectomy followed by radiation with no evidence of local recurrence per the left breast is benign.  Both axillae are benign.   LAB RESULTS:  CMP     Component Value  Date/Time   NA 139 09/01/2020 1121   K 4.Whitney 09/01/2020 1121   CL 105 09/01/2020 1121   CO2 30 09/01/2020 1121   GLUCOSE 96 09/01/2020 1121   BUN 9 09/01/2020 1121   CREATININE 0.83 09/01/2020 1121   CREATININE 0.80 06/16/2019 1218   CALCIUM 9.8 09/01/2020 1121   PROT 7.3 09/01/2020 1121   ALBUMIN 4.Whitney 09/01/2020 1121   AST 16 09/01/2020 1121   AST 15 06/16/2019 1218   ALT 14 09/01/2020 1121   ALT 14 06/16/2019 1218   ALKPHOS 62 09/01/2020 1121   BILITOT 0.6 09/01/2020 1121   BILITOT 0.8 06/16/2019 1218   GFRNONAA >60 09/01/2020 1121   GFRNONAA >60 06/16/2019 1218   GFRAA >60 12/22/2019 0949   GFRAA >60 06/16/2019 1218    No results found for: TOTALPROTELP, ALBUMINELP, A1GS, A2GS, BETS, BETA2SER, GAMS, MSPIKE, SPEI  No results found for: KPAFRELGTCHN, LAMBDASER, KAPLAMBRATIO  Lab Results  Component Value Date   WBC  5.7 09/01/2020   NEUTROABS 3.3 09/01/2020   HGB 12.5 09/01/2020   HCT 38.3 09/01/2020   MCV 89.3 09/01/2020   PLT 211 09/01/2020   No results found for: LABCA2  No components found for: [id number]  No results for input(s): INR in the last 168 hours.  No results found for: LABCA2  No results found for: POE423  No results found for: NTI144  No results found for: RXV400  No results found for: CA2729  No components found for: HGQUANT  No results found for: CEA1 / No results found for: CEA1   No results found for: AFPTUMOR  No results found for: CHROMOGRNA  No results found for: HGBA, HGBA2QUANT, HGBFQUANT, HGBSQUAN (Hemoglobinopathy evaluation)   No results found for: LDH  No results found for: IRON, TIBC, IRONPCTSAT (Iron and TIBC)  No results found for: FERRITIN  Urinalysis No results found for: COLORURINE, APPEARANCEUR, LABSPEC, PHURINE, GLUCOSEU, HGBUR, BILIRUBINUR, KETONESUR, PROTEINUR, UROBILINOGEN, NITRITE, LEUKOCYTESUR   STUDIES: No results found.   ELIGIBLE FOR AVAILABLE RESEARCH PROTOCOL: no  ASSESSMENT: 64 y.o. Donna Whitney, Donna Whitney woman status post right breast upper inner quadrant biopsy 06/09/2019 for a clinical T1b NX, stage IA invasive ductal carcinoma, grade 3, estrogen and progesterone receptor positive, HER-Whitney nonamplified, with an MRD-1 of 10%.  (1) s/p right breast lumpectomy and sentinel lymph node sampling 07/20/2019 for a pT1c pN0, stage Ia invasive ductal carcinoma, grade 3, with negative  margins  (a) a total of 5 right axillary nodes were removed  (Whitney) Oncotype score of 24, predicting a risk of recurrence outside the breast over the next 9 years of10% if the patient's only systemic therapy is antiestrogen's for 5 years.  It also predicts no benefit from chemotherapy.  (3) adjuvant radiation 08/23/2019 - 09/17/2019  (a) right breast / 42.56 Gy in 16 fractions  (b) boost / 10 Gy in 4 fractions  (4) anastrozole started 09/2019;  (a) bone  density on 08/12/2019; osteopenia T score of -1.5   (b) changed to letrozole July 2021 secondary to fatigue on anastrozole  (c) letrozole discontinued October 2021 secondary to "mental fog" and worsening GERD  (5) genetics testing on 06/23/2019 through thehe [facility] Common Hereditary Cancers Panel found no deleterious mutations in ATM, BRCA1, BRCA2, CDH1, CHEK2, PALB2, PTEN, STK11 and TP53.  APC, ATM, AXIN2, BARD1, BMPR1A, BRCA1, BRCA2, BRIP1, CDH1, CDKN2A (p14ARF), CDKN2A (p16INK4a), CKD4, CHEK2, CTNNA1, DICER1, EPCAM (Deletion/duplication testing only), GREM1 (promoter region deletion/duplication testing only), KIT, MEN1, MLH1, MSH2, MSH3, MSH6, MUTYH, NBN, NF1, NHTL1, PALB2,  PDGFRA, PMS2, POLD1, POLE, PTEN, RAD50, RAD51C, RAD51D, RNF43, SDHB, SDHC, SDHD, SMAD4, SMARCA4. STK11, TP53, TSC1, TSC2, and VHL.  The following genes were evaluated for sequence changes only: SDHA and HOXB13 c.251G>A variant only.    PLAN: Donna Whitney has a relatively low risk breast cancer and did well with her local treatment.  It is possible that she was cured with local treatment alone but a significant proportion of women in her situation will have disease recurrence and that risk is cut by nearly half if she takes antiestrogens for 5 years.  She has struggled with both anastrozole and letrozole.  On the other hand I am not sure either the reflux symptoms or the "mental fog" she reports are directly related to this medication.  We talked about diet issues and other things she can do to reduce the reflux problem.  The mental fog can be due to other medications, depression, posttraumatic stress, and to some extent the normal slight cognitive decline that occurs with age.  It is really quite impossible to untangle all this unless she goes off the letrozole for period of time and that is the plan.  She will be on a "letrozole vacation" through the holidays.  In January 2022 we will have a virtual visit.  If she finds her mental fog has  significantly cleared and her GERD has resolved then we should consider tamoxifen or exemestane.  Otherwise she may want to go back to letrozole.  Total encounter time 25 minutes.Donna Whitney C. Pressley Barsky, MD   09/01/2020 Whitney:16 PM Medical Oncology and Hematology [facility] [place], [place] [address] Tel. [phone]    Fax. [phone]   I, Wilburn Mylar, am acting as scribe for Dr. Virgie Dad. Donna Whitney.  I, Lurline Del MD, have reviewed the above documentation for accuracy and completeness, and I agree with the above.   *Total Encounter Time as defined by the Centers for Medicare and Medicaid Services includes, in addition to the face-to-face time of a patient visit (documented in the note above) non-face-to-face time: obtaining and reviewing outside history, ordering and reviewing medications, tests or procedures, care coordination (communications with other health care professionals or caregivers) and documentation in the medical record.

## 2020-09-20 ENCOUNTER — Telehealth: Payer: Self-pay | Admitting: *Deleted

## 2020-09-20 NOTE — Telephone Encounter (Signed)
Received 90 day refill request from [facility] for letrazole.  Per pt, not needed.  D/C'd by Dr Jana Hakim last visit.

## 2020-11-30 ENCOUNTER — Telehealth: Payer: Medicare Other | Admitting: Oncology

## 2020-12-25 ENCOUNTER — Inpatient Hospital Stay: Payer: Medicare Other | Attending: Oncology | Admitting: Oncology

## 2020-12-25 DIAGNOSIS — C50211 Malignant neoplasm of upper-inner quadrant of right female breast: Secondary | ICD-10-CM | POA: Diagnosis not present

## 2020-12-25 DIAGNOSIS — Z170 Estrogen receptor positive status [ER+]: Secondary | ICD-10-CM

## 2020-12-25 MED ORDER — TAMOXIFEN CITRATE 20 MG PO TABS: 20.0000 mg | ORAL_TABLET | Freq: Every day | ORAL | 12 refills | Status: AC

## 2020-12-25 NOTE — Progress Notes (Signed)
[place]  [phone] [phone]     ID: Donna Whitney DOB: 04-Jan-1956  MR#: [id number]  CHE#:[id number]  Patient Care Team: Asencion Noble, MD as PCP - General (Internal Medicine) Mauro Kaufmann, RN as Oncology Nurse Navigator Rockwell Germany, RN as Oncology Nurse Navigator Erroll Luna, MD as Consulting Physician (General Surgery) , Virgie Dad, MD as Consulting Physician (Oncology) Kyung Rudd, MD as Consulting Physician (Radiation Oncology) Linda Hedges, DO as Consulting Physician (Obstetrics and Gynecology) Richmond Campbell, MD as Consulting Physician (Gastroenterology) Chauncey Cruel, MD OTHER MD:  I connected with Donna Whitney on 12/25/20 at  1:00 PM EST by video enabled telemedicine visit and verified that I am speaking with the correct person using two identifiers.   I discussed the limitations, risks, security and privacy concerns of performing an evaluation and management service by telemedicine and the availability of in-person appointments. I also discussed with the patient that there may be a patient responsible charge related to this service. The patient expressed understanding and agreed to proceed.   Other persons participating in the visit and their role in the encounter: None  Patient's location: Home Provider's location: [place]: estrogen receptor positive breast cancer  CURRENT TREATMENT: Currently under observation   INTERVAL HISTORY: Donna Whitney was contacted today for follow up of her estrogen positive breast cancer.    At her last visit in 08/2020, she reported side effects from the letrozole. We decided to take a break from the medication to see how she feels. She tells me after 2 or 3 weeks off the letrozole she felt like herself again. The multiple symptoms she was experiencing resolved. She did really well through the holidays.  Her most recent bone density  testing on 08/12/2019 showed a T-score of -1.5, which is considered osteopenic.   REVIEW OF SYSTEMS: A detailed review of systems today was otherwise benign   COVID 19 VACCINATION STATUS: vaccinated x2, most recently 01/2020   HISTORY OF CURRENT ILLNESS: From the original intake note:  Donna Whitney presented with a 2 week history of a palpable area in the upper-inner quadrant of her right breast. She underwent bilateral diagnostic mammography with tomography and right breast ultrasonography at [facility] on 06/03/2019 showing: breast density category B; 7 mm lobulated region in the upper-inner right breast suspicious of malignancy.  Evaluation of the right axilla if performed was not documented in the ultrasound report.  Accordingly on 06/09/2019 she proceeded to biopsy of the right breast area in question. The pathology from this procedure ([id number]) showed: invasive ductal carcinoma, grade 3. Prognostic indicators significant for: estrogen receptor, 100% positive with strong staining intensity, and progesterone receptor, 30% positive, with weak staining intensity. Proliferation marker Ki67 at 10%. HER2 equivocal by immunohistochemistry (2+), but negative by fluorescent in situ hybridization. Two sets of cells were evaluated showing a signals ratio 1.63/1.62 and number per cell 4.25/4.5.  The patient's subsequent history is as detailed below.   PAST MEDICAL HISTORY: Past Medical History:  Diagnosis Date  . Anxiety   . Cancer ([place]) 05/2019   right breast cancer  . Complication of anesthesia    trouble waking up  . Family history of breast cancer   . Family history of lung cancer   . GERD (gastroesophageal reflux disease)   . Hypertension   . IBS (irritable bowel syndrome)   . PONV (postoperative nausea and vomiting)   . Ulcerative colitis ([place])  PAST SURGICAL HISTORY: Past Surgical History:  Procedure Laterality Date  . BREAST LUMPECTOMY WITH RADIOACTIVE SEED AND SENTINEL  LYMPH NODE BIOPSY Right 07/20/2019   Procedure: RIGHT BREAST LUMPECTOMY WITH RADIOACTIVE SEED AND RIGHT SENTINEL LYMPH NODE MAPPING;  Surgeon: Erroll Luna, MD;  Location: [place];  Service: General;  Laterality: Right;  PEC BLOCK  . CESAREAN SECTION    . TONSILLECTOMY     as a child  . TUBAL LIGATION      FAMILY HISTORY: Family History  Problem Relation Age of Onset  . Lung cancer Maternal Grandmother   . Breast cancer Paternal Aunt        dx 45 or older  Patient's father was 65 years old when he died from MRSA following back surgery, complicated by heavy smoking and a prior heart attack. Patient's mother died from heart attack at age 27. The patient denies a family hx of ovarian cancer. She reports a paternal aunt with breast cancer. She has 3 siblings, 1 brother and 2 sisters. She also notes lung cancer in her maternal grandmother.   GYNECOLOGIC HISTORY:  No LMP recorded. Patient is postmenopausal. Menarche: 65 years old Age at first live birth: 65 years old [place] P 2 LMP 11/2013 Contraceptive: yes, stopped after 6 months due to elevated blood pressure HRT no  Hysterectomy? no BSO? no   SOCIAL HISTORY: (updated 06/16/2019)  Donna Whitney is currently working as a Programme researcher, broadcasting/film/video. She is married. Husband Donna Whitney is her partner in this. 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 She lives at home with her husband. Son Donna Whitney, age 7, lives in [place] as a Development worker, international aid. Daughter Donna Whitney, age 62, lives in [place] as a Training and development officer for [employer]. She has two grandchildren. She attends [employer].    ADVANCED DIRECTIVES: Husband Donna Whitney is her HCPOA.   HEALTH MAINTENANCE: Social History   Tobacco Use  . Smoking status: Former Research scientist (life sciences)  . Smokeless tobacco: Never Used  Substance Use Topics  . Alcohol use: Yes    Comment: social  . Drug use: Never     Colonoscopy: 11/2019 (Dr. Earlean Shawl), repeat 2026  PAP:  01/2018  Bone density: 07/2019, -1.5 (at [facility])   No Known Allergies  Current Outpatient Medications  Medication Sig Dispense Refill  . famotidine (PEPCID) 20 MG tablet Take 20 mg by mouth daily. Pt only takes as needed    . letrozole [facility]) 2.5 MG tablet Take one tablet (2.5 mg) every Monday, Weds and Friday 60 tablet 6  . methocarbamol (ROBAXIN) 500 MG tablet Take 500 mg by mouth every 6 (six) hours as needed for muscle spasms.    . NON FORMULARY Take 1 tablet by mouth daily. [place] High Potency Probiotic    . olmesartan (BENICAR) 20 MG tablet Take 20 mg by mouth daily.    Marland Kitchen Zoster Vaccine Adjuvanted [facility]) injection Shingrix (PF) 50 mcg/0.5 mL intramuscular suspension, kit     No current facility-administered medications for this visit.    OBJECTIVE: White Whitney who appears stated age  There were no vitals filed for this visit.   There is no height or weight on file to calculate BMI.   Wt Readings from Last 3 Encounters:  09/01/20 152 lb 12.8 oz (69.3 kg)  12/22/19 153 lb 11.2 oz (69.7 kg)  09/21/19 155 lb 1.6 oz (70.4 kg)  ECOG FS:1 - Symptomatic but completely ambulatory  Telemedicine visit 12/25/2020  LAB RESULTS:  CMP     Component Value Date/Time   NA 139 09/01/2020 1121   K 4.2 09/01/2020 1121   CL 105 09/01/2020 1121   CO2 30 09/01/2020 1121   GLUCOSE 96 09/01/2020 1121   BUN 9 09/01/2020 1121   CREATININE 0.83 09/01/2020 1121   CREATININE 0.80 06/16/2019 1218   CALCIUM 9.8 09/01/2020 1121   PROT 7.3 09/01/2020 1121   ALBUMIN 4.2 09/01/2020 1121   AST 16 09/01/2020 1121   AST 15 06/16/2019 1218   ALT 14 09/01/2020 1121   ALT 14 06/16/2019 1218   ALKPHOS 62 09/01/2020 1121   BILITOT 0.6 09/01/2020 1121   BILITOT 0.8 06/16/2019 1218   GFRNONAA >60 09/01/2020 1121   GFRNONAA >60 06/16/2019 1218   GFRAA >60 12/22/2019 0949   GFRAA >60 06/16/2019 1218    No results found for: TOTALPROTELP, ALBUMINELP, A1GS, A2GS, BETS, BETA2SER, GAMS, MSPIKE,  SPEI  No results found for: KPAFRELGTCHN, LAMBDASER, KAPLAMBRATIO  Lab Results  Component Value Date   WBC 5.7 09/01/2020   NEUTROABS 3.3 09/01/2020   HGB 12.5 09/01/2020   HCT 38.3 09/01/2020   MCV 89.3 09/01/2020   PLT 211 09/01/2020   No results found for: LABCA2  No components found for: HALPFX902  No results for input(s): INR in the last 168 hours.  No results found for: LABCA2  No results found for: IOX735  No results found for: HGD924  No results found for: QAS341  No results found for: CA2729  No components found for: HGQUANT  No results found for: CEA1 / No results found for: CEA1   No results found for: AFPTUMOR  No results found for: CHROMOGRNA  No results found for: HGBA, HGBA2QUANT, HGBFQUANT, HGBSQUAN (Hemoglobinopathy evaluation)   No results found for: LDH  No results found for: IRON, TIBC, IRONPCTSAT (Iron and TIBC)  No results found for: FERRITIN  Urinalysis No results found for: COLORURINE, APPEARANCEUR, LABSPEC, PHURINE, GLUCOSEU, HGBUR, BILIRUBINUR, KETONESUR, PROTEINUR, UROBILINOGEN, NITRITE, LEUKOCYTESUR   STUDIES: No results found.   ELIGIBLE FOR AVAILABLE RESEARCH PROTOCOL: no  ASSESSMENT: 65 y.o. Donna Whitney, Donna Whitney status post right breast upper inner quadrant biopsy 06/09/2019 for a clinical T1b NX, stage IA invasive ductal carcinoma, grade 3, estrogen and progesterone receptor positive, HER-2 nonamplified, with an MRD-1 of 10%.  (1) s/p right breast lumpectomy and sentinel lymph node sampling 07/20/2019 for a pT1c pN0, stage Ia invasive ductal carcinoma, grade 3, with negative  margins  (a) a total of 5 right axillary nodes were removed  (2) Oncotype score of 24, predicting a risk of recurrence outside the breast over the next 9 years of10% if the patient's only systemic therapy is antiestrogen's for 5 years.  It also predicts no benefit from chemotherapy.  (3) adjuvant radiation 08/23/2019 - 09/17/2019  (a) right breast /  42.56 Gy in 16 fractions  (b) boost / 10 Gy in 4 fractions  (4) anastrozole started 09/2019;  (a) bone density on 08/12/2019; osteopenia T score of -1.5   (b) changed to letrozole July 2021 secondary to fatigue on anastrozole  (c) letrozole discontinued October 2021 secondary to "mental fog" and worsening GERD  (d) tamoxifen started 12/25/2020  (5) genetics testing on 06/23/2019 through thehe [facility] Common Hereditary Cancers Panel found no deleterious mutations in ATM, BRCA1, BRCA2, CDH1, CHEK2, PALB2, PTEN, STK11 and TP53.  APC, ATM, AXIN2, BARD1, BMPR1A, BRCA1, BRCA2, BRIP1, CDH1, CDKN2A (p14ARF), CDKN2A (p16INK4a), CKD4, CHEK2, CTNNA1, DICER1, EPCAM (Deletion/duplication testing only), GREM1 (promoter region deletion/duplication testing  only), KIT, MEN1, MLH1, MSH2, MSH3, MSH6, MUTYH, NBN, NF1, NHTL1, PALB2, PDGFRA, PMS2, POLD1, POLE, PTEN, RAD50, RAD51C, RAD51D, RNF43, SDHB, SDHC, SDHD, SMAD4, SMARCA4. STK11, TP53, TSC1, TSC2, and VHL.  The following genes were evaluated for sequence changes only: SDHA and HOXB13 c.251G>A variant only.    PLAN: Aldeen has not been able to tolerate 2 of our 3 aromatase inhibitors. We could try exemestane but I do not think she would tolerate that well either.  She wanted to know what would happen if she did not take any antiestrogens. I think her risk of stage IV disease with antiestrogens is about 10%. I quoted her double that risk about 20% with [place]out antiestrogens. This is very motivating to her of course.  Instead were going to give tamoxifen a try. We discussed the possible toxicities side effects and complications of this agent which include blood clots and endometrial cancer. We also discussed the mechanism of action. She understands this does not "take away her estrogen". Instead it blocks the estrogen receptor and breast cells  She is willing to give it a try. I have asked her to contact us if she has any problems and she has her next mammogram at [facility]  in June. I will see her late June in person and if she is able to tolerate this well the plan will be to continue a total of 5 years.  Total encounter time 20 minutes.Sarajane Jews C. , MD   12/25/2020 1:10 PM Medical Oncology and Hematology [facility] [place], [place] [address] Tel. [phone]    Fax. [phone]   I, Wilburn Mylar, am acting as scribe for Dr. Virgie Dad. .  I, Lurline Del MD, have reviewed the above documentation for accuracy and completeness, and I agree with the above.   *Total Encounter Time as defined by the Centers for Medicare and Medicaid Services includes, in addition to the face-to-face time of a patient visit (documented in the note above) non-face-to-face time: obtaining and reviewing outside history, ordering and reviewing medications, tests or procedures, care coordination (communications with other health care professionals or caregivers) and documentation in the medical record.

## 2021-04-05 ENCOUNTER — Telehealth: Payer: Self-pay | Admitting: *Deleted

## 2021-04-05 NOTE — Telephone Encounter (Signed)
VM left by the patient stating she has been on the Tamoxifen since last visit and overall is tolerating it well but she " has swelling in my ankles " and is wondering if it is from the Tamoxifen.  Return call number given as [phone].  Call returned - obtained identified VM- message left requesting a return call.  This RN also attempted home number on demographic page with automated statement that this is a nonworking number.  This RN will ask covering nurse tomorrow to contact the patient.

## 2021-04-06 ENCOUNTER — Telehealth: Payer: Self-pay

## 2021-04-06 NOTE — Telephone Encounter (Signed)
This nurse called pt per Mateo Flow, RN on 04/05/21. Pt reports swelling in BLE and states it has worsened this week. Pt reports she has been outside frequently. Denies pain/redness to her BLE. Pt states she does have HTN and wonders if her medication could be causing this. This LPN advised pt to call PCP. Pt verbalized thanks and understanding.

## 2021-07-31 ENCOUNTER — Encounter: Payer: Self-pay | Admitting: Oncology

## 2021-08-06 NOTE — Progress Notes (Addendum)
[name]  Telephone:([phone] Fax:(336) [phone]     ID: Valoree Agent DOB: 26-Mar-1956  MR#: [id number]  AFB#:[id number]  Patient Care Team: Asencion Noble, MD as PCP - General (Internal Medicine) Mauro Kaufmann, RN as Oncology Nurse Navigator Rockwell Germany, RN as Oncology Nurse Navigator Erroll Luna, MD as Consulting Physician (General Surgery) Magrinat, Virgie Dad, MD as Consulting Physician (Oncology) Kyung Rudd, MD as Consulting Physician (Radiation Oncology) Linda Hedges, DO as Consulting Physician (Obstetrics and Gynecology) Richmond Campbell, MD as Consulting Physician (Gastroenterology) Chauncey Cruel, MD OTHER MD:   CHIEF COMPLAINT: estrogen receptor positive breast cancer  CURRENT TREATMENT: tamoxifen   INTERVAL HISTORY: Christella returns today for follow up of her estrogen positive breast cancer.    She was switched to tamoxifen at her last (virtual) visit on 12/25/2020.  Bone density at [facility] on 07/31/2021 showed a T score of -1.7  Bilateral mammography 06/06/2021 shows breast density category B.  There was no evidence of malignancy.   REVIEW OF SYSTEMS: Eldoris is tolerating tamoxifen well.  Initially she had some headaches but those have resolved.  She does not have problems with hot flashes.  She has mild vaginal wetness.  She walks 30 minutes every morning and also walks her dogs.  She has a little bit of discomfort in the right shoulder when she moves her arm backwards reaching for something and a little bit of discomfort over the upper right arm sometimes.  She is noted that in the last few days.  Aside from that a detailed review of systems today was stable   COVID 19 VACCINATION STATUS: vaccinated x2, followed by 1 booster as of September 2022; also had COVID January 2022  HISTORY OF CURRENT ILLNESS: From the original intake note:  Lovell Roe presented with a 2 week history of a palpable area in the upper-inner quadrant  of her right breast. She underwent bilateral diagnostic mammography with tomography and right breast ultrasonography at [facility] on 06/03/2019 showing: breast density category B; 7 mm lobulated region in the upper-inner right breast suspicious of malignancy.  Evaluation of the right axilla if performed was not documented in the ultrasound report.  Accordingly on 06/09/2019 she proceeded to biopsy of the right breast area in question. The pathology from this procedure ([id number]) showed: invasive ductal carcinoma, grade 3. Prognostic indicators significant for: estrogen receptor, 100% positive with strong staining intensity, and progesterone receptor, 30% positive, with weak staining intensity. Proliferation marker Ki67 at 10%. HER2 equivocal by immunohistochemistry (2+), but negative by fluorescent in situ hybridization. Two sets of cells were evaluated showing a signals ratio 1.63/1.62 and number per cell 4.25/4.5.  The patient's subsequent history is as detailed below.   PAST MEDICAL HISTORY: Past Medical History:  Diagnosis Date   Anxiety    Cancer ([employer]) 05/2019   right breast cancer   Complication of anesthesia    trouble waking up   Family history of breast cancer    Family history of lung cancer    GERD (gastroesophageal reflux disease)    Hypertension    IBS (irritable bowel syndrome)    PONV (postoperative nausea and vomiting)    Ulcerative colitis ([place])     PAST SURGICAL HISTORY: Past Surgical History:  Procedure Laterality Date   BREAST LUMPECTOMY WITH RADIOACTIVE SEED AND SENTINEL LYMPH NODE BIOPSY Right 07/20/2019   Procedure: RIGHT BREAST LUMPECTOMY WITH RADIOACTIVE SEED AND RIGHT SENTINEL LYMPH NODE MAPPING;  Surgeon: Erroll Luna, MD;  Location: [facility];  Service: General;  Laterality: Right;  PEC BLOCK   CESAREAN SECTION     TONSILLECTOMY     as a child   TUBAL LIGATION      FAMILY HISTORY: Family History  Problem Relation Age of Onset   Lung  cancer Maternal Grandmother    Breast cancer Paternal Aunt        dx 14 or older  Patient's father was 5 years old when he died from MRSA following back surgery, complicated by heavy smoking and a prior heart attack. Patient's mother died from heart attack at age 2. The patient denies a family hx of ovarian cancer. She reports a paternal aunt with breast cancer. She has 3 siblings, 1 brother and 2 sisters. She also notes lung cancer in her maternal grandmother.   GYNECOLOGIC HISTORY:  No LMP recorded. Patient is postmenopausal. Menarche: 65 years old Age at first live birth: 65 years old [place] P 2 LMP 11/2013 Contraceptive: yes, stopped after 6 months due to elevated blood pressure HRT no  Hysterectomy? no BSO? 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 She is married. Husband [name] is her partner in this. 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 She lives at home with her husband. Son [name], age 67, lives in [place] as a Development worker, international aid. Daughter [name], age 67, lives in [place] as a Training and development officer for [employer]. She has two grandchildren. She attends [employer].    ADVANCED DIRECTIVES: Husband [name] is her HCPOA.   HEALTH MAINTENANCE: Social History   Tobacco Use   Smoking status: Former   Smokeless tobacco: Never  Substance Use Topics   Alcohol use: Yes    Comment: social   Drug use: Never     Colonoscopy: 11/2019 (Dr. Earlean Shawl), repeat 2026  PAP: 01/2018  Bone density: 07/2019, -1.5 (at [facility])   No Known Allergies  Current Outpatient Medications  Medication Sig Dispense Refill   famotidine (PEPCID) 20 MG tablet Take 20 mg by mouth daily. Pt only takes as needed     letrozole [facility]) 2.5 MG tablet Take one tablet (2.5 mg) every Monday, Weds and Friday 60 tablet 6   methocarbamol (ROBAXIN) 500 MG tablet Take 500 mg by mouth every 6 (six) hours as needed for  muscle spasms.     NON FORMULARY Take 1 tablet by mouth daily. [place] High Potency Probiotic     olmesartan (BENICAR) 20 MG tablet Take 20 mg by mouth daily.     Zoster Vaccine Adjuvanted [facility]) injection Shingrix (PF) 50 mcg/0.5 mL intramuscular suspension, kit     No current facility-administered medications for this visit.    OBJECTIVE: White woman in no acute distress Vitals:   08/07/21 1150  BP: (!) 172/90  Pulse: 86  Resp: 18  Temp: 97.7 F (36.5 C)  SpO2: 96%     Body mass index is 25.68 kg/m.   Wt Readings from Last 3 Encounters:  08/07/21 149 lb 9.6 oz (67.9 kg)  09/01/20 152 lb 12.8 oz (69.3 kg)  12/22/19 153 lb 11.2 oz (69.7 kg)  ECOG FS:1 - Symptomatic but completely ambulatory  Sclerae unicteric, EOMs intact Wearing a mask No cervical or supraclavicular adenopathy Lungs no rales or rhonchi Heart regular rate and rhythm Abd soft, nontender, positive bowel sounds MSK no focal spinal tenderness, no upper extremity lymphedema Neuro: nonfocal, well oriented, appropriate affect Breasts: The  right breast is status post lumpectomy and radiation.  There is no evidence of local recurrence.  She had a little bit of itching in the medial aspect of the areola.  That has resolved.  Careful examination of that area is unremarkable.  The left breast and both axillae are benign.   LAB RESULTS:  CMP     Component Value Date/Time   NA 139 09/01/2020 1121   K 4.2 09/01/2020 1121   CL 105 09/01/2020 1121   CO2 30 09/01/2020 1121   GLUCOSE 96 09/01/2020 1121   BUN 9 09/01/2020 1121   CREATININE 0.83 09/01/2020 1121   CREATININE 0.80 06/16/2019 1218   CALCIUM 9.8 09/01/2020 1121   PROT 7.3 09/01/2020 1121   ALBUMIN 4.2 09/01/2020 1121   AST 16 09/01/2020 1121   AST 15 06/16/2019 1218   ALT 14 09/01/2020 1121   ALT 14 06/16/2019 1218   ALKPHOS 62 09/01/2020 1121   BILITOT 0.6 09/01/2020 1121   BILITOT 0.8 06/16/2019 1218   GFRNONAA >60 09/01/2020 1121    GFRNONAA >60 06/16/2019 1218   GFRAA >60 12/22/2019 0949   GFRAA >60 06/16/2019 1218    No results found for: TOTALPROTELP, ALBUMINELP, A1GS, A2GS, BETS, BETA2SER, GAMS, MSPIKE, SPEI  No results found for: KPAFRELGTCHN, LAMBDASER, KAPLAMBRATIO  Lab Results  Component Value Date   WBC 7.1 08/07/2021   NEUTROABS 4.3 08/07/2021   HGB 12.1 08/07/2021   HCT 37.0 08/07/2021   MCV 90.0 08/07/2021   PLT 193 08/07/2021   No results found for: LABCA2  No components found for: [id number]  No results for input(s): INR in the last 168 hours.  No results found for: LABCA2  No results found for: OZD664  No results found for: QIH474  No results found for: QVZ563  No results found for: CA2729  No components found for: HGQUANT  No results found for: CEA1 / No results found for: CEA1   No results found for: AFPTUMOR  No results found for: CHROMOGRNA  No results found for: HGBA, HGBA2QUANT, HGBFQUANT, HGBSQUAN (Hemoglobinopathy evaluation)   No results found for: LDH  No results found for: IRON, TIBC, IRONPCTSAT (Iron and TIBC)  No results found for: FERRITIN  Urinalysis No results found for: COLORURINE, APPEARANCEUR, LABSPEC, PHURINE, GLUCOSEU, HGBUR, BILIRUBINUR, KETONESUR, PROTEINUR, UROBILINOGEN, NITRITE, LEUKOCYTESUR   STUDIES: No results found.   ELIGIBLE FOR AVAILABLE RESEARCH PROTOCOL: no  ASSESSMENT: 65 y.o. Milus Glazier, [place] woman status post right breast upper inner quadrant biopsy 06/09/2019 for a clinical T1b NX, stage IA invasive ductal carcinoma, grade 3, estrogen and progesterone receptor positive, HER-2 nonamplified, with an MRD-1 of 10%.  (1) s/p right breast lumpectomy and sentinel lymph node sampling 07/20/2019 for a pT1c pN0, stage Ia invasive ductal carcinoma, grade 3, with negative  margins  (a) a total of 5 right axillary nodes were removed  (2) Oncotype score of 24, predicting a risk of recurrence outside the breast over the next 9 years of10% if  the patient's only systemic therapy is antiestrogen's for 5 years.  It also predicts no benefit from chemotherapy.  (3) adjuvant radiation 08/23/2019 - 09/17/2019  (a) right breast / 42.56 Gy in 16 fractions  (b) boost / 10 Gy in 4 fractions  (4) anastrozole started 09/2019;  (a) bone density on 08/12/2019; osteopenia T score of -1.5   (b) changed to letrozole July 2021 secondary to fatigue on anastrozole  (c) letrozole discontinued October 2021 secondary to "mental fog" and worsening GERD  (d) tamoxifen  started 12/25/2020  (5) genetics testing on 06/23/2019 through thehe [facility] Common Hereditary Cancers Panel found no deleterious mutations in ATM, BRCA1, BRCA2, CDH1, CHEK2, PALB2, PTEN, STK11 and TP53.  APC, ATM, AXIN2, BARD1, BMPR1A, BRCA1, BRCA2, BRIP1, CDH1, CDKN2A (p14ARF), CDKN2A (p16INK4a), CKD4, CHEK2, CTNNA1, DICER1, EPCAM (Deletion/duplication testing only), GREM1 (promoter region deletion/duplication testing only), KIT, MEN1, MLH1, MSH2, MSH3, MSH6, MUTYH, NBN, NF1, NHTL1, PALB2, PDGFRA, PMS2, POLD1, POLE, PTEN, RAD50, RAD51C, RAD51D, RNF43, SDHB, SDHC, SDHD, SMAD4, SMARCA4. STK11, TP53, TSC1, TSC2, and VHL.  The following genes were evaluated for sequence changes only: SDHA and HOXB13 c.251G>A variant only.    PLAN: Vihana is now 2 years out from definitive surgery for her breast cancer with no evidence of disease recurrence.  This is favorable.  She is tolerating tamoxifen much better than the aromatase inhibitors and the plan is to continue that until she completes 5 years of antiestrogens.  I think she either has a little bit of bursitis on the right shoulder or early carpal tunnel.  If the symptoms persist she will let us know or she will directly contact her orthopedist.  Otherwise she will return to see [place] in 1 year.  She knows to call for any other issue that may develop before then  Total encounter time 20 minutes.Sarajane Jews C. Magrinat, MD   08/07/2021 11:53 AM Medical  Oncology and Hematology [facility] [place], [place] [address] Tel. [phone]    Fax. [phone]   I, Wilburn Mylar, am acting as scribe for Dr. Virgie Dad. Magrinat.  I, Lurline Del MD, have reviewed the above documentation for accuracy and completeness, and I agree with the above.   *Total Encounter Time as defined by the Centers for Medicare and Medicaid Services includes, in addition to the face-to-face time of a patient visit (documented in the note above) non-face-to-face time: obtaining and reviewing outside history, ordering and reviewing medications, tests or procedures, care coordination (communications with other health care professionals or caregivers) and documentation in the medical record.

## 2021-08-07 ENCOUNTER — Inpatient Hospital Stay: Payer: Medicare Other | Attending: Oncology

## 2021-08-07 ENCOUNTER — Inpatient Hospital Stay (HOSPITAL_BASED_OUTPATIENT_CLINIC_OR_DEPARTMENT_OTHER): Payer: Medicare Other | Admitting: Oncology

## 2021-08-07 ENCOUNTER — Other Ambulatory Visit: Payer: Self-pay

## 2021-08-07 VITALS — BP 172/90 | HR 86 | Temp 97.7°F | Resp 18 | Ht 64.0 in | Wt 149.6 lb

## 2021-08-07 DIAGNOSIS — Z170 Estrogen receptor positive status [ER+]: Secondary | ICD-10-CM | POA: Insufficient documentation

## 2021-08-07 DIAGNOSIS — M8580 Other specified disorders of bone density and structure, unspecified site: Secondary | ICD-10-CM | POA: Insufficient documentation

## 2021-08-07 DIAGNOSIS — Z923 Personal history of irradiation: Secondary | ICD-10-CM | POA: Diagnosis not present

## 2021-08-07 DIAGNOSIS — C50211 Malignant neoplasm of upper-inner quadrant of right female breast: Secondary | ICD-10-CM | POA: Diagnosis present

## 2021-08-07 LAB — COMPREHENSIVE METABOLIC PANEL
ALT: 12 U/L (ref 0–44)
AST: 16 U/L (ref 15–41)
Albumin: 4.1 g/dL (ref 3.5–5.0)
Alkaline Phosphatase: 43 U/L (ref 38–126)
Anion gap: 8 (ref 5–15)
BUN: 12 mg/dL (ref 8–23)
CO2: 26 mmol/L (ref 22–32)
Calcium: 9.1 mg/dL (ref 8.9–10.3)
Chloride: 106 mmol/L (ref 98–111)
Creatinine, Ser: 0.79 mg/dL (ref 0.44–1.00)
GFR, Estimated: >60 mL/min (ref >60)
Glucose, Bld: 93 mg/dL (ref 70–99)
Potassium: 3.9 mmol/L (ref 3.5–5.1)
Sodium: 140 mmol/L (ref 135–145)
Total Bilirubin: 0.4 mg/dL (ref 0.3–1.2)
Total Protein: 6.8 g/dL (ref 6.5–8.1)

## 2021-08-07 LAB — CBC WITH DIFFERENTIAL/PLATELET
Abs Immature Granulocytes: 0.01 10*3/uL (ref 0.00–0.07)
Basophils Absolute: 0 10*3/uL (ref 0.0–0.1)
Basophils Relative: 0 %
Eosinophils Absolute: 0.1 10*3/uL (ref 0.0–0.5)
Eosinophils Relative: 1 %
HCT: 37 % (ref 36.0–46.0)
Hemoglobin: 12.1 g/dL (ref 12.0–15.0)
Immature Granulocytes: 0 %
Lymphocytes Relative: 30 %
Lymphs Abs: 2.1 10*3/uL (ref 0.7–4.0)
MCH: 29.4 pg (ref 26.0–34.0)
MCHC: 32.7 g/dL (ref 30.0–36.0)
MCV: 90 fL (ref 80.0–100.0)
Monocytes Absolute: 0.6 10*3/uL (ref 0.1–1.0)
Monocytes Relative: 8 %
Neutro Abs: 4.3 10*3/uL (ref 1.7–7.7)
Neutrophils Relative %: 61 %
Platelets: 193 10*3/uL (ref 150–400)
RBC: 4.11 MIL/uL (ref 3.87–5.11)
RDW: 13.2 % (ref 11.5–15.5)
WBC: 7.1 10*3/uL (ref 4.0–10.5)
nRBC: 0 % (ref 0.0–0.2)

## 2021-08-07 MED ORDER — TAMOXIFEN CITRATE 20 MG PO TABS: 20.0000 mg | ORAL_TABLET | Freq: Every day | ORAL | 4 refills | Status: AC

## 2021-12-18 DIAGNOSIS — K589 Irritable bowel syndrome without diarrhea: Secondary | ICD-10-CM | POA: Insufficient documentation

## 2021-12-28 ENCOUNTER — Other Ambulatory Visit: Payer: Self-pay

## 2021-12-28 ENCOUNTER — Encounter (INDEPENDENT_AMBULATORY_CARE_PROVIDER_SITE_OTHER): Payer: Self-pay | Admitting: Ophthalmology

## 2021-12-28 ENCOUNTER — Ambulatory Visit (INDEPENDENT_AMBULATORY_CARE_PROVIDER_SITE_OTHER): Payer: Medicare Other | Admitting: Ophthalmology

## 2021-12-28 DIAGNOSIS — H43812 Vitreous degeneration, left eye: Secondary | ICD-10-CM | POA: Diagnosis not present

## 2021-12-28 DIAGNOSIS — H25813 Combined forms of age-related cataract, bilateral: Secondary | ICD-10-CM | POA: Diagnosis not present

## 2021-12-28 DIAGNOSIS — I10 Essential (primary) hypertension: Secondary | ICD-10-CM

## 2021-12-28 DIAGNOSIS — H35033 Hypertensive retinopathy, bilateral: Secondary | ICD-10-CM

## 2021-12-28 NOTE — Progress Notes (Signed)
[facility] Note  12/28/2021     CHIEF COMPLAINT Patient presents for Retina Evaluation   HISTORY OF PRESENT ILLNESS: Donna Whitney is a 66 y.o. female who presents to the clinic today for:   HPI     Retina Evaluation   In left eye.  This started 2 months ago.  Duration of 2 months.  Associated Symptoms Flashes and Floaters.  I, the attending physician,  performed the HPI with the patient and updated documentation appropriately.        Comments   New pt here for ret eval for possible RH OS. Pt states back before Christmas she noticed floaters in OS. As time progressed the floaters seemed to become a bit worse then this past Wednesday she noticed the floaters were reddish/brown. She does report a flash in OS this am and a sharp pain in OS yesterday. She does report blurry vision OS. Pt was recently given anxiety medication, having attacks and BP spikes. Reports this usually due to white coat syndrome/seeing a doctor. Pt wears rx specs for distance.       Last edited by Bernarda Caffey, MD on 12/28/2021  4:08 PM.    Pt here on the referral of [facility], pt states she started seeing floaters before Christmas, they were initially dark in color, but now they are reddish-brown, she had a sharp pain shoot through her eye yesterday and today she saw a fol  Referring physician: Asencion Noble, MD [address] [place],  [place] [address]  HISTORICAL INFORMATION:   Selected notes from the [facility] Referred by Adline Potter, OD ([place]) for PVD OS LEE:  Ocular Hx- PMH-    CURRENT MEDICATIONS: No current outpatient medications on file. (Ophthalmic Drugs)   No current facility-administered medications for this visit. (Ophthalmic Drugs)   Current Outpatient Medications (Other)  Medication Sig   escitalopram (LEXAPRO) 10 MG tablet Take 10 mg by mouth daily.   famotidine (PEPCID) 20 MG tablet Take 20 mg by  mouth daily. Pt only takes as needed   olmesartan (BENICAR) 20 MG tablet Take 20 mg by mouth daily.   tamoxifen (NOLVADEX) 20 MG tablet Take 20 mg by mouth daily.   No current facility-administered medications for this visit. (Other)   REVIEW OF SYSTEMS: ROS   Positive for: Cardiovascular Negative for: Constitutional, Gastrointestinal, Neurological, Skin, Genitourinary, Musculoskeletal, HENT, Endocrine, Eyes, Respiratory, Psychiatric, Allergic/Imm, Heme/Lymph Last edited by Kingsley Spittle, COT on 12/28/2021  1:44 PM.     ALLERGIES No Known Allergies  PAST MEDICAL HISTORY Past Medical History:  Diagnosis Date   Anxiety    Cancer ([place]) 05/2019   right breast cancer   Complication of anesthesia    trouble waking up   Family history of breast cancer    Family history of lung cancer    GERD (gastroesophageal reflux disease)    Hypertension    IBS (irritable bowel syndrome)    PONV (postoperative nausea and vomiting)    Ulcerative colitis ([place])    Past Surgical History:  Procedure Laterality Date   BREAST LUMPECTOMY WITH RADIOACTIVE SEED AND SENTINEL LYMPH NODE BIOPSY Right 07/20/2019   Procedure: RIGHT BREAST LUMPECTOMY WITH RADIOACTIVE SEED AND RIGHT SENTINEL LYMPH NODE MAPPING;  Surgeon: Erroll Luna, MD;  Location: [facility];  Service: General;  Laterality: Right;  PEC BLOCK   CESAREAN SECTION     TONSILLECTOMY     as  a child   TUBAL LIGATION     FAMILY HISTORY Family History  Problem Relation Age of Onset   Lung cancer Maternal Grandmother    Breast cancer Paternal Aunt        dx 84 or older   SOCIAL HISTORY Social History   Tobacco Use   Smoking status: Former   Smokeless tobacco: Never  Substance Use Topics   Alcohol use: Yes    Comment: social   Drug use: Never       OPHTHALMIC EXAM:  Base Eye Exam     Visual Acuity (Snellen - Linear)       Right Left   Dist cc 20/20 -2 20/25   Dist ph cc  NI         Tonometry (Tonopen,  1:55 PM)       Right Left   Pressure 15 19         Pupils       Dark   Right Dilated   Left Dilated         Visual Fields (Counting fingers)       Left Right    Full Full         Extraocular Movement       Right Left    Full, Ortho Full, Ortho         Neuro/Psych     Oriented x3: Yes   Mood/Affect: Normal         Dilation     Both eyes: 1.0% Mydriacyl, 2.5% Phenylephrine @ 1:56 PM           Slit Lamp and Fundus Exam     Slit Lamp Exam       Right Left   Lids/Lashes Dermatochalasis - upper lid Dermatochalasis - upper lid   Conjunctiva/Sclera Mild nasal and temporal pinguecula Mild nasal and temporal pinguecula   Cornea arcus, trace PEE arcus, trace PEE   Anterior Chamber Deep and quiet Deep and quiet   Iris Round and dilated Round and dilated   Lens 2+ Nuclear sclerosis, 2+ Cortical cataract 2+ Nuclear sclerosis, 2+ Cortical cataract   Anterior Vitreous Vitreous syneresis Vitreous syneresis, Posterior vitreous detachment, blood stained vitreous condensations         Fundus Exam       Right Left   Disc Pink and Sharp, mild tilt, +SVP, mild temporal PPA Pink and Sharp, mild tilt, +SVP, mild temporal PPA/PPP   C/D Ratio 0.4 0.2   Macula Flat, Blunted foveal reflex, Drusen, RPE mottling and clumping, No heme or edema Flat, good foveal reflex, mild RPE mottling and clumping, No heme or edema   Vessels mild attenuation mild attenuation   Periphery Attached, peripheral cystoid degeneration and peripheral drusen IT periphery, No RT/RD, No heme Attached, mild paving stone IT periphery, very mild pre-retinal heme settled inferiorly           Refraction     Wearing Rx       Sphere Cylinder Axis   Right -2.00 +1.00 161   Left -2.50 +1.00 011         Manifest Refraction       Sphere Cylinder Axis Dist VA   Right -2.00 +1.00 160 20/25   Left -2.50 +1.00 010 20/20           IMAGING AND PROCEDURES  Imaging and Procedures for  12/28/2021  OCT, Retina - OU - Both Eyes       Right Eye Central Foveal  Thickness: 247. Findings include normal foveal contour, no IRF, no SRF, retinal drusen , myopic contour (Focal ellipsoid disruiption ST fovea, partial PVD).   Left Eye Central Foveal Thickness: 255. Findings include normal foveal contour, no IRF, no SRF, myopic contour (Vitreous opacities).   Notes *Images captured and stored on drive  Diagnosis / Impression:  NFP; no IRF/SRF OU OD: Focal ellipsoid disruiption ST fovea, partial PVD OS: Vitreous opacities   Clinical management:  See below  Abbreviations: NFP - Normal foveal profile. CME - cystoid macular edema. PED - pigment epithelial detachment. IRF - intraretinal fluid. SRF - subretinal fluid. EZ - ellipsoid zone. ERM - epiretinal membrane. ORA - outer retinal atrophy. ORT - outer retinal tubulation. SRHM - subretinal hyper-reflective material. IRHM - intraretinal hyper-reflective material            ASSESSMENT/PLAN:    ICD-10-CM   1. Posterior vitreous detachment of left eye  H43.812 OCT, Retina - OU - Both Eyes    2. Essential hypertension  I10     3. Hypertensive retinopathy of both eyes  H35.033     4. Combined forms of age-related cataract of both eyes  H25.813      1. Hemorrhagic PVD / vitreous syneresis OS - initial onset of floaters around Christmas 2022, acute increase in red floaters this past Saturday, 2.4.23 - exam shows mild blood stained vit condensations  - Discussed findings and prognosis  - No RT or RD on 360 scleral depressed exam  - Reviewed s/s of RT/RD  - Strict return precautions for any such RT/RD signs/symptoms - VH precautions reviewed -- minimize activities, keep head elevated, avoid ASA/NSAIDs/blood thinners as able   - f/u in 5-6 wks -- DFE/OCT  2,3. Hypertensive retinopathy OU - discussed importance of tight BP control - monitor  4. Mixed Cataract OU - The symptoms of cataract, surgical options, and  treatments and risks were discussed with patient. - discussed diagnosis and progression - not yet visually significant - monitor for now  Ophthalmic Meds Ordered this visit:  No orders of the defined types were placed in this encounter.    Return for f/u 5-6 weeks, PVD OS, DFE, OCT.  There are no Patient Instructions on file for this visit.  Explained the diagnoses, plan, and follow up with the patient and they expressed understanding.  Patient expressed understanding of the importance of proper follow up care.   Gardiner Sleeper, M.D., Ph.D. Diseases & Surgery of the Retina and Vitreous [facility]  I have reviewed the above documentation for accuracy and completeness, and I agree with the above. Gardiner Sleeper, M.D., Ph.D. 12/28/21 4:14 PM  Abbreviations: M myopia (nearsighted); A astigmatism; H hyperopia (farsighted); P presbyopia; Mrx spectacle prescription;  CTL contact lenses; OD right eye; OS left eye; OU both eyes  XT exotropia; ET esotropia; PEK punctate epithelial keratitis; PEE punctate epithelial erosions; DES dry eye syndrome; MGD meibomian gland dysfunction; ATs artificial tears; PFAT's preservative free artificial tears; [place] nuclear sclerotic cataract; PSC posterior subcapsular cataract; ERM epi-retinal membrane; PVD posterior vitreous detachment; RD retinal detachment; DM diabetes mellitus; DR diabetic retinopathy; NPDR non-proliferative diabetic retinopathy; PDR proliferative diabetic retinopathy; CSME clinically significant macular edema; DME diabetic macular edema; dbh dot blot hemorrhages; CWS cotton wool spot; POAG primary open angle glaucoma; C/D cup-to-disc ratio; HVF humphrey visual field; GVF goldmann visual field; OCT optical coherence tomography; IOP intraocular pressure; BRVO Branch retinal vein occlusion; CRVO central retinal vein occlusion; CRAO central  retinal artery occlusion; BRAO branch retinal artery occlusion; RT retinal tear; SB  scleral buckle; PPV pars plana vitrectomy; VH Vitreous hemorrhage; PRP panretinal laser photocoagulation; IVK intravitreal kenalog; VMT vitreomacular traction; MH Macular hole;  NVD neovascularization of the disc; NVE neovascularization elsewhere; AREDS age related eye disease study; ARMD age related macular degeneration; POAG primary open angle glaucoma; EBMD epithelial/anterior basement membrane dystrophy; ACIOL anterior chamber intraocular lens; IOL intraocular lens; PCIOL posterior chamber intraocular lens; Phaco/IOL phacoemulsification with intraocular lens placement; [place] photorefractive keratectomy; LASIK laser assisted in situ keratomileusis; HTN hypertension; DM diabetes mellitus; COPD chronic obstructive pulmonary disease

## 2022-01-04 ENCOUNTER — Other Ambulatory Visit: Payer: Self-pay

## 2022-01-04 MED ORDER — TAMOXIFEN CITRATE 20 MG PO TABS: 20.0000 mg | ORAL_TABLET | Freq: Every day | ORAL | 2 refills | Status: DC

## 2022-01-22 NOTE — Progress Notes (Shared)
[facility] Note  02/01/2022     CHIEF COMPLAINT Patient presents for No chief complaint on file.   HISTORY OF PRESENT ILLNESS: Donna Whitney is a 66 y.o. female who presents to the clinic today for:    Pt here on the referral of [facility], pt states she started seeing floaters before Christmas, they were initially dark in color, but now they are reddish-brown, she had a sharp pain shoot through her eye yesterday and today she saw a fol  Referring physician: Asencion Noble, MD [address] [place],  [place] [address]  HISTORICAL INFORMATION:   Selected notes from the [facility] Referred by Adline Potter, OD ([place]) for PVD OS LEE:  Ocular Hx- PMH-    CURRENT MEDICATIONS: No current outpatient medications on file. (Ophthalmic Drugs)   No current facility-administered medications for this visit. (Ophthalmic Drugs)   Current Outpatient Medications (Other)  Medication Sig   escitalopram (LEXAPRO) 10 MG tablet Take 10 mg by mouth daily.   famotidine (PEPCID) 20 MG tablet Take 20 mg by mouth daily. Pt only takes as needed   olmesartan (BENICAR) 20 MG tablet Take 20 mg by mouth daily.   tamoxifen (NOLVADEX) 20 MG tablet Take 1 tablet (20 mg total) by mouth daily.   No current facility-administered medications for this visit. (Other)   REVIEW OF SYSTEMS:   ALLERGIES No Known Allergies  PAST MEDICAL HISTORY Past Medical History:  Diagnosis Date   Anxiety    Cancer ([place]) 05/2019   right breast cancer   Complication of anesthesia    trouble waking up   Family history of breast cancer    Family history of lung cancer    GERD (gastroesophageal reflux disease)    Hypertension    IBS (irritable bowel syndrome)    PONV (postoperative nausea and vomiting)    Ulcerative colitis ([place])    Past Surgical History:  Procedure Laterality Date   BREAST LUMPECTOMY WITH RADIOACTIVE SEED AND SENTINEL  LYMPH NODE BIOPSY Right 07/20/2019   Procedure: RIGHT BREAST LUMPECTOMY WITH RADIOACTIVE SEED AND RIGHT SENTINEL LYMPH NODE MAPPING;  Surgeon: Erroll Luna, MD;  Location: [facility];  Service: General;  Laterality: Right;  [place]     as a child   TUBAL LIGATION     FAMILY HISTORY Family History  Problem Relation Age of Onset   Lung cancer Maternal Grandmother    Breast cancer Paternal Aunt        dx 59 or older   SOCIAL HISTORY Social History   Tobacco Use   Smoking status: Former   Smokeless tobacco: Never  Substance Use Topics   Alcohol use: Yes    Comment: social   Drug use: Never       OPHTHALMIC EXAM:  Not recorded    IMAGING AND PROCEDURES  Imaging and Procedures for 02/01/2022          ASSESSMENT/PLAN:    ICD-10-CM   1. Posterior vitreous detachment of left eye  H43.812     2. Essential hypertension  I10     3. Hypertensive retinopathy of both eyes  H35.033     4. Combined forms of age-related cataract of both eyes  H25.813       1. Hemorrhagic PVD / vitreous syneresis OS - initial onset of floaters around Christmas 2022, acute increase in red floaters  this past Saturday, 2.4.23 - exam shows mild blood stained vit condensations  - Discussed findings and prognosis  - No RT or RD on 360 scleral depressed exam  - Reviewed s/s of RT/RD  - Strict return precautions for any such RT/RD signs/symptoms - VH precautions reviewed -- minimize activities, keep head elevated, avoid ASA/NSAIDs/blood thinners as able  - f/u in 5-6 wks -- DFE/OCT  2,3. Hypertensive retinopathy OU - discussed importance of tight BP control - monitor   4. Mixed Cataract OU - The symptoms of cataract, surgical options, and treatments and risks were discussed with patient. - discussed diagnosis and progression - not yet visually significant - monitor for now   Ophthalmic Meds Ordered this visit:  No orders of the  defined types were placed in this encounter.    No follow-ups on file.  There are no Patient Instructions on file for this visit.  Explained the diagnoses, plan, and follow up with the patient and they expressed understanding.  Patient expressed understanding of the importance of proper follow up care.   This document serves as a record of services personally performed by Gardiner Sleeper, MD, PhD. It was created on their behalf by Leonie Douglas, an ophthalmic technician. The creation of this record is the provider's dictation and/or activities during the visit.    Electronically signed by: Leonie Douglas COA, 01/22/22  10:41 AM  Gardiner Sleeper, M.D., Ph.D. Diseases & Surgery of the Retina and Vitreous [facility]: M myopia (nearsighted); A astigmatism; H hyperopia (farsighted); P presbyopia; Mrx spectacle prescription;  CTL contact lenses; OD right eye; OS left eye; OU both eyes  XT exotropia; ET esotropia; PEK punctate epithelial keratitis; PEE punctate epithelial erosions; DES dry eye syndrome; MGD meibomian gland dysfunction; ATs artificial tears; PFAT's preservative free artificial tears; [place] nuclear sclerotic cataract; PSC posterior subcapsular cataract; ERM epi-retinal membrane; PVD posterior vitreous detachment; RD retinal detachment; DM diabetes mellitus; DR diabetic retinopathy; NPDR non-proliferative diabetic retinopathy; PDR proliferative diabetic retinopathy; CSME clinically significant macular edema; DME diabetic macular edema; dbh dot blot hemorrhages; CWS cotton wool spot; POAG primary open angle glaucoma; C/D cup-to-disc ratio; HVF humphrey visual field; GVF goldmann visual field; OCT optical coherence tomography; IOP intraocular pressure; BRVO Branch retinal vein occlusion; CRVO central retinal vein occlusion; CRAO central retinal artery occlusion; BRAO branch retinal artery occlusion; RT retinal tear; SB scleral buckle; PPV pars plana  vitrectomy; VH Vitreous hemorrhage; PRP panretinal laser photocoagulation; IVK intravitreal kenalog; VMT vitreomacular traction; MH Macular hole;  NVD neovascularization of the disc; NVE neovascularization elsewhere; AREDS age related eye disease study; ARMD age related macular degeneration; POAG primary open angle glaucoma; EBMD epithelial/anterior basement membrane dystrophy; ACIOL anterior chamber intraocular lens; IOL intraocular lens; PCIOL posterior chamber intraocular lens; Phaco/IOL phacoemulsification with intraocular lens placement; [place] photorefractive keratectomy; LASIK laser assisted in situ keratomileusis; HTN hypertension; DM diabetes mellitus; COPD chronic obstructive pulmonary disease

## 2022-02-01 ENCOUNTER — Encounter (INDEPENDENT_AMBULATORY_CARE_PROVIDER_SITE_OTHER): Payer: Self-pay | Admitting: Ophthalmology

## 2022-02-01 ENCOUNTER — Ambulatory Visit (INDEPENDENT_AMBULATORY_CARE_PROVIDER_SITE_OTHER): Payer: Medicare Other | Admitting: Ophthalmology

## 2022-02-01 ENCOUNTER — Other Ambulatory Visit: Payer: Self-pay

## 2022-02-01 DIAGNOSIS — H35033 Hypertensive retinopathy, bilateral: Secondary | ICD-10-CM | POA: Diagnosis not present

## 2022-02-01 DIAGNOSIS — H25813 Combined forms of age-related cataract, bilateral: Secondary | ICD-10-CM

## 2022-02-01 DIAGNOSIS — H43812 Vitreous degeneration, left eye: Secondary | ICD-10-CM

## 2022-02-01 DIAGNOSIS — I10 Essential (primary) hypertension: Secondary | ICD-10-CM

## 2022-02-02 ENCOUNTER — Encounter (INDEPENDENT_AMBULATORY_CARE_PROVIDER_SITE_OTHER): Payer: Self-pay | Admitting: Ophthalmology

## 2022-03-05 ENCOUNTER — Other Ambulatory Visit: Payer: Self-pay | Admitting: Hematology and Oncology

## 2022-05-06 ENCOUNTER — Other Ambulatory Visit: Payer: Self-pay | Admitting: Hematology and Oncology

## 2022-05-12 ENCOUNTER — Other Ambulatory Visit: Payer: Self-pay

## 2022-05-12 DIAGNOSIS — I8393 Asymptomatic varicose veins of bilateral lower extremities: Secondary | ICD-10-CM

## 2022-06-03 ENCOUNTER — Other Ambulatory Visit: Payer: Self-pay | Admitting: Hematology and Oncology

## 2022-06-13 ENCOUNTER — Encounter (HOSPITAL_COMMUNITY): Payer: Medicare Other

## 2022-06-18 ENCOUNTER — Telehealth: Payer: Self-pay | Admitting: Hematology and Oncology

## 2022-06-18 NOTE — Telephone Encounter (Signed)
Rescheduled appointment per providers template. Left message.  ? ?

## 2022-06-20 ENCOUNTER — Encounter: Payer: Self-pay | Admitting: Hematology and Oncology

## 2022-07-02 ENCOUNTER — Other Ambulatory Visit: Payer: Self-pay | Admitting: *Deleted

## 2022-07-02 MED ORDER — TAMOXIFEN CITRATE 20 MG PO TABS: 20.0000 mg | ORAL_TABLET | Freq: Every day | ORAL | 6 refills | Status: DC

## 2022-08-05 ENCOUNTER — Ambulatory Visit (INDEPENDENT_AMBULATORY_CARE_PROVIDER_SITE_OTHER): Payer: Medicare Other | Admitting: Physician Assistant

## 2022-08-05 ENCOUNTER — Ambulatory Visit (HOSPITAL_COMMUNITY)
Admission: RE | Admit: 2022-08-05 | Discharge: 2022-08-05 | Disposition: A | Discharge status: Home / Self Care | Payer: Medicare Other | Source: Ambulatory Visit | Attending: Vascular Surgery | Admitting: Vascular Surgery

## 2022-08-05 VITALS — BP 173/89 | HR 67 | Temp 97.7°F | Resp 20 | Ht 64.0 in | Wt 154.4 lb

## 2022-08-05 DIAGNOSIS — I8393 Asymptomatic varicose veins of bilateral lower extremities: Secondary | ICD-10-CM

## 2022-08-05 DIAGNOSIS — I872 Venous insufficiency (chronic) (peripheral): Secondary | ICD-10-CM | POA: Diagnosis not present

## 2022-08-05 NOTE — Progress Notes (Signed)
Office Note     CC:  follow up Requesting Provider:  Asencion Noble, MD  HPI: Donna Whitney is a 66 y.o. (07-10-1956) female who presents for evaluation of bilateral lower extremity varicose veins.  She states that she has noticed more prominent varicose veins in bilateral lower extremities over the past year.  She denies any history of DVT, venous ulcerations, trauma, or prior vascular intervention.  She notices occasional tenderness in the varicosities especially of the left leg around the level of the knee and posterior thigh.  She does not wear compression or elevate her legs during the day.  She denies tobacco use.   Past Medical History:  Diagnosis Date   Anxiety    Cancer ([place]) 05/2019   right breast cancer   Complication of anesthesia    trouble waking up   Family history of breast cancer    Family history of lung cancer    GERD (gastroesophageal reflux disease)    Hypertension    IBS (irritable bowel syndrome)    PONV (postoperative nausea and vomiting)    Ulcerative colitis ([place])     Past Surgical History:  Procedure Laterality Date   BREAST LUMPECTOMY WITH RADIOACTIVE SEED AND SENTINEL LYMPH NODE BIOPSY Right 07/20/2019   Procedure: RIGHT BREAST LUMPECTOMY WITH RADIOACTIVE SEED AND RIGHT SENTINEL LYMPH NODE MAPPING;  Surgeon: Erroll Luna, MD;  Location: [facility];  Service: General;  Laterality: Right;  [place]     as a child   TUBAL LIGATION      Social History   Socioeconomic History   Marital status: Married    Spouse name: Not on file   Number of children: 2   Years of education: Not on file   Highest education level: Not on file  Occupational History   Not on file  Tobacco Use   Smoking status: Former    Passive exposure: Never   Smokeless tobacco: Never  Substance and Sexual Activity   Alcohol use: Yes    Comment: social   Drug use: Never   Sexual activity: Not on file  Other Topics  Concern   Not on file  Social History Narrative   Not on file   Social Determinants of Health   Financial Resource Strain: Not on file  Food Insecurity: Not on file  Transportation Needs: Not on file  Physical Activity: Not on file  Stress: Not on file  Social Connections: Not on file  Intimate Partner Violence: Not on file    Family History  Problem Relation Age of Onset   Lung cancer Maternal Grandmother    Breast cancer Paternal Aunt        dx 79 or older    Current Outpatient Medications  Medication Sig Dispense Refill   escitalopram (LEXAPRO) 10 MG tablet Take 10 mg by mouth daily.     famotidine (PEPCID) 20 MG tablet Take 20 mg by mouth daily. Pt only takes as needed     olmesartan (BENICAR) 20 MG tablet Take 20 mg by mouth daily.     tamoxifen (NOLVADEX) 20 MG tablet Take 1 tablet (20 mg total) by mouth daily. 30 tablet 6   No current facility-administered medications for this visit.    No Known Allergies   REVIEW OF SYSTEMS:   '[X]'$  denotes positive finding, '[ ]'$  denotes negative finding Cardiac  Comments:  Chest pain or chest pressure:    Shortness  of breath upon exertion:    Short of breath when lying flat:    Irregular heart rhythm:        Vascular    Pain in calf, thigh, or hip brought on by ambulation:    Pain in feet at night that wakes you up from your sleep:     Blood clot in your veins:    Leg swelling:         Pulmonary    Oxygen at home:    Productive cough:     Wheezing:         Neurologic    Sudden weakness in arms or legs:     Sudden numbness in arms or legs:     Sudden onset of difficulty speaking or slurred speech:    Temporary loss of vision in one eye:     Problems with dizziness:         Gastrointestinal    Blood in stool:     Vomited blood:         Genitourinary    Burning when urinating:     Blood in urine:        Psychiatric    Major depression:         Hematologic    Bleeding problems:    Problems with blood  clotting too easily:        Skin    Rashes or ulcers:        Constitutional    Fever or chills:      PHYSICAL EXAMINATION:  Vitals:   08/05/22 1246  BP: (!) 173/89  Pulse: 67  Resp: 20  Temp: 97.7 F (36.5 C)  TempSrc: Temporal  SpO2: 98%  Weight: 154 lb 6.4 oz (70 kg)  Height: '5\' 4"'$  (1.626 m)    General:  WDWN in NAD; vital signs documented above Gait: Not observed HENT: WNL, normocephalic Pulmonary: normal non-labored breathing , without Rales, rhonchi,  wheezing Cardiac: regular HR Abdomen: soft, NT, no masses Skin: without rashes Vascular Exam/Pulses:  Right Left  Radial 2+ (normal) 2+ (normal)  DP 2+ (normal) 2+ (normal)   Extremities: without ischemic changes, without Gangrene , without cellulitis; without open wounds; ropey varicosities of the left medial and posterior distal thigh as well as the medial lower leg; varicosities noted over the right posterior knee and medial lower leg; spider veins of bilateral ankles and feet Musculoskeletal: no muscle wasting or atrophy  Neurologic: A&O X 3;  No focal weakness or paresthesias are detected Psychiatric:  The pt has Normal affect.   Non-Invasive Vascular Imaging:   Left lower extremity venous reflux study negative for DVT Incompetent deep system Incompetent left greater saphenous vein Incompetent accessory saphenous vein    ASSESSMENT/PLAN:: 66 y.o. female here for evaluation of varicose veins of bilateral lower extremities  -Bilateral lower extremity varicosities cause occasional discomfort however do not impact patient's day-to-day life -Left lower extremity venous reflux study was negative for DVT however demonstrated incompetent deep venous system as well as incompetent left greater saphenous vein and accessory saphenous vein throughout the course.  Based on her reflux study she would be a good candidate for laser ablation and stab phlebectomy. -Patient would prefer maximizing conservative management  which will include regular use of knee-high 15 to 20 mmHg compression socks, periodic elevation of the legs above the level of the heart during the day, and avoiding prolonged sitting and standing.  She will notify our office if she would like to be  evaluated for laser ablation and stab phlebectomy after at least 3 months of conservative management.  She also is aware she would need to upgrade to thigh-high compression if she was interested in laser ablation therapy.  Patient will follow-up as needed for now.   Dagoberto Ligas, PA-C [facility] [phone]  Clinic MD:   Trula Slade

## 2022-08-06 ENCOUNTER — Other Ambulatory Visit: Payer: Self-pay

## 2022-08-06 DIAGNOSIS — Z170 Estrogen receptor positive status [ER+]: Secondary | ICD-10-CM

## 2022-08-06 DIAGNOSIS — Z803 Family history of malignant neoplasm of breast: Secondary | ICD-10-CM

## 2022-08-06 DIAGNOSIS — Z801 Family history of malignant neoplasm of trachea, bronchus and lung: Secondary | ICD-10-CM

## 2022-08-07 ENCOUNTER — Encounter: Payer: Self-pay | Admitting: Hematology and Oncology

## 2022-08-07 ENCOUNTER — Inpatient Hospital Stay: Payer: Medicare Other | Attending: Hematology and Oncology

## 2022-08-07 ENCOUNTER — Other Ambulatory Visit: Payer: Medicare Other

## 2022-08-07 ENCOUNTER — Ambulatory Visit: Payer: Medicare Other | Admitting: Hematology and Oncology

## 2022-08-07 ENCOUNTER — Telehealth: Payer: Self-pay | Admitting: Hematology and Oncology

## 2022-08-07 ENCOUNTER — Inpatient Hospital Stay (HOSPITAL_BASED_OUTPATIENT_CLINIC_OR_DEPARTMENT_OTHER): Payer: Medicare Other | Admitting: Hematology and Oncology

## 2022-08-07 ENCOUNTER — Other Ambulatory Visit: Payer: Self-pay

## 2022-08-07 VITALS — BP 148/81 | HR 73 | Temp 98.1°F | Resp 18 | Ht 64.0 in | Wt 153.5 lb

## 2022-08-07 DIAGNOSIS — Z923 Personal history of irradiation: Secondary | ICD-10-CM | POA: Insufficient documentation

## 2022-08-07 DIAGNOSIS — Z79899 Other long term (current) drug therapy: Secondary | ICD-10-CM | POA: Diagnosis not present

## 2022-08-07 DIAGNOSIS — Z170 Estrogen receptor positive status [ER+]: Secondary | ICD-10-CM

## 2022-08-07 DIAGNOSIS — R5383 Other fatigue: Secondary | ICD-10-CM | POA: Insufficient documentation

## 2022-08-07 DIAGNOSIS — Z801 Family history of malignant neoplasm of trachea, bronchus and lung: Secondary | ICD-10-CM

## 2022-08-07 DIAGNOSIS — Z803 Family history of malignant neoplasm of breast: Secondary | ICD-10-CM

## 2022-08-07 DIAGNOSIS — C50211 Malignant neoplasm of upper-inner quadrant of right female breast: Secondary | ICD-10-CM | POA: Insufficient documentation

## 2022-08-07 DIAGNOSIS — M85852 Other specified disorders of bone density and structure, left thigh: Secondary | ICD-10-CM | POA: Diagnosis not present

## 2022-08-07 DIAGNOSIS — R0609 Other forms of dyspnea: Secondary | ICD-10-CM | POA: Diagnosis not present

## 2022-08-07 LAB — CMP (CANCER CENTER ONLY)
ALT: 14 U/L (ref 0–44)
AST: 17 U/L (ref 15–41)
Albumin: 4.3 g/dL (ref 3.5–5.0)
Alkaline Phosphatase: 37 U/L — ABNORMAL LOW (ref 38–126)
Anion gap: 7 (ref 5–15)
BUN: 11 mg/dL (ref 8–23)
CO2: 28 mmol/L (ref 22–32)
Calcium: 9 mg/dL (ref 8.9–10.3)
Chloride: 103 mmol/L (ref 98–111)
Creatinine: 0.85 mg/dL (ref 0.44–1.00)
GFR, Estimated: >60 mL/min (ref >60)
Glucose, Bld: 100 mg/dL — ABNORMAL HIGH (ref 70–99)
Potassium: 3.9 mmol/L (ref 3.5–5.1)
Sodium: 138 mmol/L (ref 135–145)
Total Bilirubin: 0.4 mg/dL (ref 0.3–1.2)
Total Protein: 6.7 g/dL (ref 6.5–8.1)

## 2022-08-07 LAB — CBC WITH DIFFERENTIAL (CANCER CENTER ONLY)
Abs Immature Granulocytes: 0.01 10*3/uL (ref 0.00–0.07)
Basophils Absolute: 0 10*3/uL (ref 0.0–0.1)
Basophils Relative: 0 %
Eosinophils Absolute: 0.2 10*3/uL (ref 0.0–0.5)
Eosinophils Relative: 2 %
HCT: 36 % (ref 36.0–46.0)
Hemoglobin: 12.1 g/dL (ref 12.0–15.0)
Immature Granulocytes: 0 %
Lymphocytes Relative: 31 %
Lymphs Abs: 2.2 10*3/uL (ref 0.7–4.0)
MCH: 30.9 pg (ref 26.0–34.0)
MCHC: 33.6 g/dL (ref 30.0–36.0)
MCV: 91.8 fL (ref 80.0–100.0)
Monocytes Absolute: 0.6 10*3/uL (ref 0.1–1.0)
Monocytes Relative: 9 %
Neutro Abs: 4.2 10*3/uL (ref 1.7–7.7)
Neutrophils Relative %: 58 %
Platelet Count: 181 10*3/uL (ref 150–400)
RBC: 3.92 MIL/uL (ref 3.87–5.11)
RDW: 13.2 % (ref 11.5–15.5)
WBC Count: 7.2 10*3/uL (ref 4.0–10.5)
nRBC: 0 % (ref 0.0–0.2)

## 2022-08-07 NOTE — Progress Notes (Signed)
[place]  [phone] [phone]     ID: Donna Whitney DOB: 03-22-1956  MR#: [id number]  OVF#:[id number]  Patient Care Team: Asencion Noble, MD as PCP - General (Internal Medicine) Mauro Kaufmann, RN as Oncology Nurse Navigator Rockwell Germany, RN as Oncology Nurse Navigator Erroll Luna, MD as Consulting Physician (General Surgery) Magrinat, Virgie Dad, MD (Inactive) as Consulting Physician (Oncology) Kyung Rudd, MD as Consulting Physician (Radiation Oncology) Linda Hedges, DO as Consulting Physician (Obstetrics and Gynecology) Richmond Campbell, MD as Consulting Physician (Gastroenterology) Benay Pike, MD  CHIEF COMPLAINT: estrogen receptor positive breast cancer  CURRENT TREATMENT: Currently under observation   INTERVAL HISTORY:  Donna Whitney was contacted today for follow up of her estrogen positive breast cancer.   Donna Whitney has previously tried letrozole and anastrozole and did not tolerate it well.  She had some mood swings and felt spaced out.  She is now on tamoxifen for adjuvant antiestrogen therapy and she is tolerating it really well.   She complains of some fatigue and some shortness of breath on exertion but she is not quite sure if this is related to tamoxifen. She otherwise denies any breast changes.  Most recent mammogram was done in first week of August, she had a recall for an ultrasound which was unremarkable She has osteopenia with a T score of -1.7 and left femur neck consistent with osteopenia.  She continues on vitamin D supplementation  REVIEW OF SYSTEMS: A detailed review of systems today was otherwise benign   COVID 19 VACCINATION STATUS: vaccinated x2, most recently 01/2020   HISTORY OF CURRENT ILLNESS: From the original intake note:  Donna Whitney presented with a 2 week history of a palpable area in the upper-inner quadrant of her right breast. She underwent bilateral diagnostic mammography with  tomography and right breast ultrasonography at [facility] on 06/03/2019 showing: breast density category B; 7 mm lobulated region in the upper-inner right breast suspicious of malignancy.  Evaluation of the right axilla if performed was not documented in the ultrasound report.  Accordingly on 06/09/2019 she proceeded to biopsy of the right breast area in question. The pathology from this procedure ([id number]) showed: invasive ductal carcinoma, grade 3. Prognostic indicators significant for: estrogen receptor, 100% positive with strong staining intensity, and progesterone receptor, 30% positive, with weak staining intensity. Proliferation marker Ki67 at 10%. HER2 equivocal by immunohistochemistry (2+), but negative by fluorescent in situ hybridization. Two sets of cells were evaluated showing a signals ratio 1.63/1.62 and number per cell 4.25/4.5.  The patient's subsequent history is as detailed below.   PAST MEDICAL HISTORY: Past Medical History:  Diagnosis Date   Anxiety    Cancer ([place]) 05/2019   right breast cancer   Complication of anesthesia    trouble waking up   Family history of breast cancer    Family history of lung cancer    GERD (gastroesophageal reflux disease)    Hypertension    IBS (irritable bowel syndrome)    PONV (postoperative nausea and vomiting)    Ulcerative colitis ([place])     PAST SURGICAL HISTORY: Past Surgical History:  Procedure Laterality Date   BREAST LUMPECTOMY WITH RADIOACTIVE SEED AND SENTINEL LYMPH NODE BIOPSY Right 07/20/2019   Procedure: RIGHT BREAST LUMPECTOMY WITH RADIOACTIVE SEED AND RIGHT SENTINEL LYMPH NODE MAPPING;  Surgeon: Erroll Luna, MD;  Location: [place];  Service: General;  Laterality: Right;  [place]  as a child   TUBAL LIGATION      FAMILY HISTORY: Family History  Problem Relation Age of Onset   Lung cancer Maternal Grandmother    Breast cancer Paternal Aunt        dx 31 or  older  Patient's father was 66 years Donna when he died from MRSA following back surgery, complicated by heavy smoking and a prior heart attack. Patient's mother died from heart attack at age 30. The patient denies a family hx of ovarian cancer. She reports a paternal aunt with breast cancer. She has 3 siblings, 1 brother and 2 sisters. She also notes lung cancer in her maternal grandmother.   GYNECOLOGIC HISTORY:  No LMP recorded. Patient is postmenopausal. Menarche: 16 years Donna Age at first live birth: 64 years Donna [place] P 2 LMP 11/2013 Contraceptive: yes, stopped after 6 months due to elevated blood pressure HRT no  Hysterectomy? no BSO? no   SOCIAL HISTORY: (updated 06/16/2019)  Donna Whitney is currently working as a Programme researcher, broadcasting/film/video. She is married. Husband [name] is her partner in this. 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 She lives at home with her husband. Son [name], age 30, lives in [place] as a Development worker, international aid. Daughter [name], age 23, lives in [place] as a Training and development officer for [employer]. She has two grandchildren. She attends [employer].    ADVANCED DIRECTIVES: Husband [name] is her HCPOA.   HEALTH MAINTENANCE: Social History   Tobacco Use   Smoking status: Former    Passive exposure: Never   Smokeless tobacco: Never  Substance Use Topics   Alcohol use: Yes    Comment: social   Drug use: Never     Colonoscopy: 11/2019 (Dr. Earlean Shawl), repeat 2026  PAP: 01/2018  Bone density: 07/2019, -1.5 (at [facility])   No Known Allergies  Current Outpatient Medications  Medication Sig Dispense Refill   escitalopram (LEXAPRO) 10 MG tablet Take 10 mg by mouth daily.     famotidine (PEPCID) 20 MG tablet Take 20 mg by mouth daily. Pt only takes as needed     olmesartan (BENICAR) 20 MG tablet Take 20 mg by mouth daily.     tamoxifen (NOLVADEX) 20 MG tablet Take 1 tablet (20 mg total) by mouth daily. 30 tablet 6   No current  facility-administered medications for this visit.    OBJECTIVE: [place] Whitney who appears stated age  There were no vitals filed for this visit.   There is no height or weight on file to calculate BMI.   Wt Readings from Last 3 Encounters:  08/05/22 154 lb 6.4 oz (70 kg)  08/07/21 149 lb 9.6 oz (67.9 kg)  09/01/20 152 lb 12.8 oz (69.3 kg)  ECOG FS:1 - Symptomatic but completely ambulatory  Physical Exam Constitutional:      Appearance: Normal appearance.  Chest:     Comments: Bilateral breasts inspected and palpated.  Excellent cosmetic outcome.  No palpable lymphadenopathy or hepatosplenomegaly Musculoskeletal:        General: No swelling or tenderness.     Cervical back: Normal range of motion and neck supple. No rigidity.  Lymphadenopathy:     Cervical: No cervical adenopathy.  Skin:    General: Skin is warm and dry.  Neurological:     Mental Status: She is alert.      LAB RESULTS:  CMP     Component Value Date/Time   NA 140 08/07/2021 1144  K 3.9 08/07/2021 1144   CL 106 08/07/2021 1144   CO2 26 08/07/2021 1144   GLUCOSE 93 08/07/2021 1144   BUN 12 08/07/2021 1144   CREATININE 0.79 08/07/2021 1144   CREATININE 0.80 06/16/2019 1218   CALCIUM 9.1 08/07/2021 1144   PROT 6.8 08/07/2021 1144   ALBUMIN 4.1 08/07/2021 1144   AST 16 08/07/2021 1144   AST 15 06/16/2019 1218   ALT 12 08/07/2021 1144   ALT 14 06/16/2019 1218   ALKPHOS 43 08/07/2021 1144   BILITOT 0.4 08/07/2021 1144   BILITOT 0.8 06/16/2019 1218   GFRNONAA >60 08/07/2021 1144   GFRNONAA >60 06/16/2019 1218   GFRAA >60 12/22/2019 0949   GFRAA >60 06/16/2019 1218    No results found for: "TOTALPROTELP", "ALBUMINELP", "A1GS", "A2GS", "BETS", "BETA2SER", "GAMS", "MSPIKE", "SPEI"  No results found for: "KPAFRELGTCHN", "LAMBDASER", "KAPLAMBRATIO"  Lab Results  Component Value Date   WBC 7.1 08/07/2021   NEUTROABS 4.3 08/07/2021   HGB 12.1 08/07/2021   HCT 37.0 08/07/2021   MCV 90.0 08/07/2021    PLT 193 08/07/2021   No results found for: "LABCA2"  No components found for: "VOHYWV371"  No results for input(s): "INR" in the last 168 hours.  No results found for: "LABCA2"  No results found for: "GGY694"  No results found for: "CAN125"  No results found for: "CAN153"  No results found for: "CA2729"  No components found for: "HGQUANT"  No results found for: "CEA1", "CEA" / No results found for: "CEA1", "CEA"   No results found for: "AFPTUMOR"  No results found for: "CHROMOGRNA"  No results found for: "HGBA", "HGBA2QUANT", "HGBFQUANT", "HGBSQUAN" (Hemoglobinopathy evaluation)   No results found for: "LDH"  No results found for: "IRON", "TIBC", "IRONPCTSAT" (Iron and TIBC)  No results found for: "FERRITIN"  Urinalysis No results found for: "COLORURINE", "APPEARANCEUR", "LABSPEC", "PHURINE", "GLUCOSEU", "HGBUR", "BILIRUBINUR", "KETONESUR", "PROTEINUR", "UROBILINOGEN", "NITRITE", "LEUKOCYTESUR"   STUDIES: VAS [place] LOWER EXTREMITY VENOUS REFLUX  Result Date: 08/05/2022  Lower Venous Reflux Study Patient Name:  Donna Whitney  Date of Exam:   08/05/2022 Medical Rec #: [id number]            Accession #:    [id number] Date of Birth: December 03, 1955            Patient Gender: F Patient Age:   66 years Exam Location:  [facility] Procedure:      VAS [place] LOWER EXTREMITY VENOUS REFLUX Referring Phys: Servando Snare --------------------------------------------------------------------------------  Indications: Varicosities, heaviness of the left lower extremity.  Performing Technologist: Alvia Grove RVT  Examination Guidelines: A complete evaluation includes B-mode imaging, spectral Doppler, color Doppler, and power Doppler as needed of all accessible portions of each vessel. Bilateral testing is considered an integral part of a complete examination. Limited examinations for reoccurring indications may be performed as noted. The reflux portion of the exam is performed with  the patient in reverse Trendelenburg. Significant venous reflux is defined as >500 ms in the superficial venous system, and >1 second in the deep venous system.  Venous Reflux Times +-----+---------+------+-----------+------------+--------+ RIGHTReflux NoRefluxReflux TimeDiameter cmsComments                Yes                                  +-----+---------+------+-----------+------------+--------+  0.36             +-----+---------+------+-----------+------------+--------+  +--------------+---------+------+-----------+------------+----------+ LEFT          Reflux NoRefluxReflux TimeDiameter cmsComments                           Yes                                    +--------------+---------+------+-----------+------------+----------+ CFV                     yes   >1 second                        +--------------+---------+------+-----------+------------+----------+ FV mid                  yes   >1 second                        +--------------+---------+------+-----------+------------+----------+ Popliteal               yes   >1 second                        +--------------+---------+------+-----------+------------+----------+ GSV at SFJ              yes    >500 ms      0.75               +--------------+---------+------+-----------+------------+----------+ GSV prox thigh          yes    >500 ms      0.83               +--------------+---------+------+-----------+------------+----------+ GSV mid thigh           yes    >500 ms      0.69               +--------------+---------+------+-----------+------------+----------+ GSV dist thigh          yes    >500 ms      0.55               +--------------+---------+------+-----------+------------+----------+ GSV at knee             yes    >500 ms      0.64               +--------------+---------+------+-----------+------------+----------+ GSV prox  calf           yes    >500 ms      0.63               +--------------+---------+------+-----------+------------+----------+ GSV mid calf  no                            0.19               +--------------+---------+------+-----------+------------+----------+ SSV Pop Fossa no                            0.10    too small  +--------------+---------+------+-----------+------------+----------+ SSV prox calf no                            0.27               +--------------+---------+------+-----------+------------+----------+  SSV mid calf            yes    >500 ms      0.52    mid to dst +--------------+---------+------+-----------+------------+----------+ AASV O                  yes    >500 ms      0.38               +--------------+---------+------+-----------+------------+----------+ AASV prx                yes    >500 ms      0.38               +--------------+---------+------+-----------+------------+----------+ AASV mid                yes    >500 ms      0.36               +--------------+---------+------+-----------+------------+----------+   Summary: Left: - No evidence of deep vein thrombosis seen in the left lower extremity, from the common femoral through the popliteal veins. - Venous reflux is noted in the left common femoral vein. - Venous reflux is noted in the left sapheno-femoral junction. - Venous reflux is noted in the left greater saphenous vein in the thigh. - Venous reflux is noted in the left greater saphenous vein in the calf. - Venous reflux is noted in the left femoral vein. - Venous reflux is noted in the left popliteal vein. - Venous reflux is noted in the left short saphenous vein. - Venous reflux is noted in the AASV proximal to mid segment.  *See table(s) above for measurements and observations. Electronically signed by Harold Barban MD on 08/05/2022 at 4:10:37 PM.    Final      ELIGIBLE FOR AVAILABLE RESEARCH PROTOCOL: no  ASSESSMENT:  66 y.o. Donna Whitney, Donna Whitney status post right breast upper inner quadrant biopsy 06/09/2019 for a clinical T1b NX, stage IA invasive ductal carcinoma, grade 3, estrogen and progesterone receptor positive, HER-2 nonamplified, with an MRD-1 of 10%.  (1) s/p right breast lumpectomy and sentinel lymph node sampling 07/20/2019 for a pT1c pN0, stage Ia invasive ductal carcinoma, grade 3, with negative  margins  (a) a total of 5 right axillary nodes were removed  (2) Oncotype score of 24, predicting a risk of recurrence outside the breast over the next 9 years of10% if the patient's only systemic therapy is antiestrogen's for 5 years.  It also predicts no benefit from chemotherapy.  (3) adjuvant radiation 08/23/2019 - 09/17/2019  (a) right breast / 42.56 Gy in 16 fractions  (b) boost / 10 Gy in 4 fractions  (4) anastrozole started 09/2019;  (a) bone density on 08/12/2019; osteopenia T score of -1.5   (b) changed to letrozole July 2021 secondary to fatigue on anastrozole  (c) letrozole discontinued October 2021 secondary to "mental fog" and worsening GERD  (d) tamoxifen started 12/25/2020  (5) genetics testing on 06/23/2019 through thehe [facility] Common Hereditary Cancers Panel found no deleterious mutations in ATM, BRCA1, BRCA2, CDH1, CHEK2, PALB2, PTEN, STK11 and TP53.  APC, ATM, AXIN2, BARD1, BMPR1A, BRCA1, BRCA2, BRIP1, CDH1, CDKN2A (p14ARF), CDKN2A (p16INK4a), CKD4, CHEK2, CTNNA1, DICER1, EPCAM (Deletion/duplication testing only), GREM1 (promoter region deletion/duplication testing only), KIT, MEN1, MLH1, MSH2, MSH3, MSH6, MUTYH, NBN, NF1, NHTL1, PALB2, PDGFRA, PMS2, POLD1, POLE, PTEN, RAD50, RAD51C, RAD51D, RNF43, SDHB, SDHC, SDHD, SMAD4, SMARCA4. STK11, TP53, TSC1, TSC2, and VHL.  The following genes  were evaluated for sequence changes only: SDHA and HOXB13 c.251G>A variant only.   PLAN:  Donna Whitney is currently on tamoxifen for adjuvant antiestrogen therapy and she is tolerating this really well.   She complains of some fatigue and shortness of breath on exertion which I believe is unrelated to tamoxifen since it is mild and not necessarily progressive for the past several months.  I have encouraged her to talk to her PCP for cardiac evaluation in this case. She will otherwise return to clinic in 1 year.  No concerns on physical exam today.  Most recent mammogram and ultrasound reviewed.  She will be due for repeat mammogram in August 2024.  Repeat bone density in September 2024, last bone density noted osteopenia.  Encouraged calcium/vitamin D supplementation and weightbearing exercises. Total time spent: 30 minutes  *Total Encounter Time as defined by the Centers for Medicare and Medicaid Services includes, in addition to the face-to-face time of a patient visit (documented in the note above) non-face-to-face time: obtaining and reviewing outside history, ordering and reviewing medications, tests or procedures, care coordination (communications with other health care professionals or caregivers) and documentation in the medical record.

## 2022-08-07 NOTE — Telephone Encounter (Signed)
Spoke with patient to confirm 9/25 appointment

## 2022-12-31 ENCOUNTER — Other Ambulatory Visit: Payer: Self-pay | Admitting: *Deleted

## 2023-02-04 ENCOUNTER — Other Ambulatory Visit: Payer: Self-pay

## 2023-02-04 ENCOUNTER — Telehealth: Payer: Self-pay

## 2023-02-04 MED ORDER — TAMOXIFEN CITRATE 20 MG PO TABS: 20.0000 mg | ORAL_TABLET | Freq: Every day | ORAL | 6 refills | Status: DC

## 2023-02-04 NOTE — Telephone Encounter (Signed)
Returned Pt's call regarding tamoxifen refill. Pt states she asked for refill a few weeks ago but rx was never sent to pharmacy. Rx refill placed to [facility], called pharmacy who confirmed receiving request. Pt verbalized understanding.

## 2023-07-30 ENCOUNTER — Other Ambulatory Visit: Payer: Self-pay | Admitting: Hematology and Oncology

## 2023-08-12 NOTE — Progress Notes (Unsigned)
[facility] Cancer Follow up:    Donna Perches, MD [address] [place] [address]   DIAGNOSIS: Cancer Staging  Malignant neoplasm of upper-inner quadrant of right breast in female, estrogen receptor positive (HCC) Staging form: Breast, AJCC 8th Edition - Clinical stage from 06/16/2019: Stage IA (cT1b, cN0, cM0, G3, ER+, PR+, HER2-) - Unsigned Stage prefix: Initial diagnosis Method of lymph node assessment: Clinical Histologic grading system: 3 grade system - Pathologic stage from 08/10/2019: Stage IA (pT1c, pN0(sn), cM0, G3, ER+, PR+, HER2-, Oncotype DX score: 24) - Unsigned Stage prefix: Initial diagnosis Method of lymph node assessment: Sentinel lymph node biopsy Multigene prognostic tests performed: Oncotype DX Recurrence score range: Greater than or equal to 11 Histologic grading system: 3 grade system   SUMMARY OF ONCOLOGIC HISTORY:  Donna Whitney, Donna Whitney status post right breast upper inner quadrant biopsy 06/09/2019 for a clinical T1b NX, stage IA invasive ductal carcinoma, grade 3, estrogen and progesterone receptor positive, HER-2 nonamplified, with an MRD-1 of 10%.   (1) s/p right breast lumpectomy and sentinel lymph node sampling 07/20/2019 for a pT1c pN0, stage Ia invasive ductal carcinoma, grade 3, with negative  margins             (a) a total of 5 right axillary nodes were removed   (2) Oncotype score of 24, predicting a risk of recurrence outside the breast over the next 9 years of10% if the patient's only systemic therapy is antiestrogen's for 5 years.  It also predicts no benefit from chemotherapy.   (3) adjuvant radiation 08/23/2019 - 09/17/2019             (a) right breast / 42.56 Gy in 16 fractions             (b) boost / 10 Gy in 4 fractions   (4) anastrozole started 09/2019;             (a) bone density on 08/12/2019; osteopenia T score of -1.5              (b) changed to letrozole July 2021 secondary to fatigue on anastrozole              (c) letrozole discontinued October 2021 secondary to "mental fog" and worsening GERD             (d) tamoxifen started 12/25/2020   (5) genetics testing on 06/23/2019 through thehe [facility] Common Hereditary Cancers Panel found no deleterious mutations in ATM, BRCA1, BRCA2, CDH1, CHEK2, PALB2, PTEN, STK11 and TP53.  APC, ATM, AXIN2, BARD1, BMPR1A, BRCA1, BRCA2, BRIP1, CDH1, CDKN2A (p14ARF), CDKN2A (p16INK4a), CKD4, CHEK2, CTNNA1, DICER1, EPCAM (Deletion/duplication testing only), GREM1 (promoter region deletion/duplication testing only), KIT, MEN1, MLH1, MSH2, MSH3, MSH6, MUTYH, NBN, NF1, NHTL1, PALB2, PDGFRA, PMS2, POLD1, POLE, PTEN, RAD50, RAD51C, RAD51D, RNF43, SDHB, SDHC, SDHD, SMAD4, SMARCA4. STK11, TP53, TSC1, TSC2, and VHL.  The following genes were evaluated for sequence changes only: SDHA and HOXB13 c.251G>A variant only.   CURRENT THERAPY: tamoxifen  INTERVAL HISTORY: Donna Whitney 67 y.o. female returns for follow up of her history of breast cancer.  She continues on Tamoxifen daily.  She tolerates this well.     Patient Active Problem List   Diagnosis Date Noted   Genetic testing 06/23/2019   Family history of breast cancer    Family history of lung cancer    Malignant neoplasm of upper-inner quadrant of right breast in female, estrogen receptor positive (HCC) 06/14/2019  has No Known Allergies.  MEDICAL HISTORY: Past Medical History:  Diagnosis Date   Anxiety    Cancer (HCC) 05/2019   right breast cancer   Complication of anesthesia    trouble waking up   Family history of breast cancer    Family history of lung cancer    GERD (gastroesophageal reflux disease)    Hypertension    IBS (irritable bowel syndrome)    PONV (postoperative nausea and vomiting)    Ulcerative colitis (HCC)     SURGICAL HISTORY: Past Surgical History:  Procedure Laterality Date   BREAST LUMPECTOMY WITH RADIOACTIVE SEED AND SENTINEL LYMPH NODE BIOPSY Right 07/20/2019   Procedure:  RIGHT BREAST LUMPECTOMY WITH RADIOACTIVE SEED AND RIGHT SENTINEL LYMPH NODE MAPPING;  Surgeon: Harriette Bouillon, MD;  Location: [facility];  Service: General;  Laterality: Right;  PEC BLOCK   CESAREAN SECTION     TONSILLECTOMY     as a child   TUBAL LIGATION      SOCIAL HISTORY: Social History   Socioeconomic History   Marital status: Married    Spouse name: Not on file   Number of children: 2   Years of education: Not on file   Highest education level: Not on file  Occupational History   Not on file  Tobacco Use   Smoking status: Former    Passive exposure: Never   Smokeless tobacco: Never  Substance and Sexual Activity   Alcohol use: Yes    Comment: social   Drug use: Never   Sexual activity: Not on file  Other Topics Concern   Not on file  Social History Narrative   Not on file   Social Determinants of Health   Financial Resource Strain: Not on file  Food Insecurity: Not on file  Transportation Needs: Not on file  Physical Activity: Not on file  Stress: Not on file  Social Connections: Not on file  Intimate Partner Violence: Not on file    FAMILY HISTORY: Family History  Problem Relation Age of Onset   Lung cancer Maternal Grandmother    Breast cancer Paternal Aunt        dx 58 or older    Review of Systems  Constitutional:  Negative for appetite change, chills, fatigue, fever and unexpected weight change.  HENT:   Negative for hearing loss, lump/mass and trouble swallowing.   Eyes:  Negative for eye problems and icterus.  Respiratory:  Negative for chest tightness, cough and shortness of breath.   Cardiovascular:  Negative for chest pain, leg swelling and palpitations.  Gastrointestinal:  Negative for abdominal distention, abdominal pain, constipation, diarrhea, nausea and vomiting.  Endocrine: Negative for hot flashes.  Genitourinary:  Negative for difficulty urinating.   Musculoskeletal:  Negative for arthralgias.  Skin:  Negative for  itching and rash.  Neurological:  Negative for dizziness, extremity weakness, headaches and numbness.  Hematological:  Negative for adenopathy. Does not bruise/bleed easily.  Psychiatric/Behavioral:  Negative for depression. The patient is not nervous/anxious.       PHYSICAL EXAMINATION  There were no vitals filed for this visit.  Physical Exam Constitutional:      General: She is not in acute distress.    Appearance: Normal appearance. She is not toxic-appearing.  HENT:     Head: Normocephalic and atraumatic.     Mouth/Throat:     Mouth: Mucous membranes are moist.     Pharynx: Oropharynx is clear. No oropharyngeal exudate or posterior oropharyngeal erythema.  Eyes:  General: No scleral icterus. Cardiovascular:     Rate and Rhythm: Normal rate and regular rhythm.     Pulses: Normal pulses.     Heart sounds: Normal heart sounds.  Pulmonary:     Effort: Pulmonary effort is normal.     Breath sounds: Normal breath sounds.  Abdominal:     General: Abdomen is flat. Bowel sounds are normal. There is no distension.     Palpations: Abdomen is soft.     Tenderness: There is no abdominal tenderness.  Musculoskeletal:        General: No swelling.     Cervical back: Neck supple.  Lymphadenopathy:     Cervical: No cervical adenopathy.  Skin:    General: Skin is warm and dry.     Findings: No rash.  Neurological:     General: No focal deficit present.     Mental Status: She is alert.  Psychiatric:        Mood and Affect: Mood normal.        Behavior: Behavior normal.     LABORATORY DATA:  CBC    Component Value Date/Time   WBC 7.2 08/07/2022 1244   WBC 7.1 08/07/2021 1144   RBC 3.92 08/07/2022 1244   HGB 12.1 08/07/2022 1244   HCT 36.0 08/07/2022 1244   PLT 181 08/07/2022 1244   MCV 91.8 08/07/2022 1244   MCH 30.9 08/07/2022 1244   MCHC 33.6 08/07/2022 1244   RDW 13.2 08/07/2022 1244   LYMPHSABS 2.2 08/07/2022 1244   MONOABS 0.6 08/07/2022 1244   EOSABS 0.2  08/07/2022 1244   BASOSABS 0.0 08/07/2022 1244    CMP     Component Value Date/Time   NA 138 08/07/2022 1244   K 3.9 08/07/2022 1244   CL 103 08/07/2022 1244   CO2 28 08/07/2022 1244   GLUCOSE 100 (H) 08/07/2022 1244   BUN 11 08/07/2022 1244   CREATININE 0.85 08/07/2022 1244   CALCIUM 9.0 08/07/2022 1244   PROT 6.7 08/07/2022 1244   ALBUMIN 4.3 08/07/2022 1244   AST 17 08/07/2022 1244   ALT 14 08/07/2022 1244   ALKPHOS 37 (L) 08/07/2022 1244   BILITOT 0.4 08/07/2022 1244   GFRNONAA >60 08/07/2022 1244   GFRAA >60 12/22/2019 0949   GFRAA >60 06/16/2019 1218        ASSESSMENT and THERAPY PLAN:   No problem-specific Assessment & Plan notes found for this encounter.   All questions were answered. The patient knows to call the clinic with any problems, questions or concerns. We can certainly see the patient much sooner if necessary.  Total encounter time:*** minutes*in face-to-face visit time, chart review, lab review, care coordination, order entry, and documentation of the encounter time.  Lillard Anes, NP 08/12/23 11:16 PM Medical Oncology and Hematology [facility] [address] [place], [place] [address] Tel. [phone]    Fax. [phone]  *Total Encounter Time as defined by the Centers for Medicare and Medicaid Services includes, in addition to the face-to-face time of a patient visit (documented in the note above) non-face-to-face time: obtaining and reviewing outside history, ordering and reviewing medications, tests or procedures, care coordination (communications with other health care professionals or caregivers) and documentation in the medical record.

## 2023-08-13 ENCOUNTER — Inpatient Hospital Stay: Payer: Medicare Other | Attending: Hematology and Oncology | Admitting: Adult Health

## 2023-08-13 ENCOUNTER — Telehealth: Payer: Self-pay

## 2023-08-13 ENCOUNTER — Encounter: Payer: Self-pay | Admitting: Adult Health

## 2023-08-13 ENCOUNTER — Inpatient Hospital Stay: Payer: Medicare Other

## 2023-08-13 VITALS — BP 159/83 | HR 79 | Temp 99.8°F | Resp 18 | Ht 64.0 in | Wt 159.4 lb

## 2023-08-13 DIAGNOSIS — Z9221 Personal history of antineoplastic chemotherapy: Secondary | ICD-10-CM | POA: Insufficient documentation

## 2023-08-13 DIAGNOSIS — L989 Disorder of the skin and subcutaneous tissue, unspecified: Secondary | ICD-10-CM | POA: Insufficient documentation

## 2023-08-13 DIAGNOSIS — Z170 Estrogen receptor positive status [ER+]: Secondary | ICD-10-CM | POA: Insufficient documentation

## 2023-08-13 DIAGNOSIS — Z803 Family history of malignant neoplasm of breast: Secondary | ICD-10-CM | POA: Insufficient documentation

## 2023-08-13 DIAGNOSIS — Z801 Family history of malignant neoplasm of trachea, bronchus and lung: Secondary | ICD-10-CM | POA: Diagnosis not present

## 2023-08-13 DIAGNOSIS — Z923 Personal history of irradiation: Secondary | ICD-10-CM | POA: Diagnosis not present

## 2023-08-13 DIAGNOSIS — R140 Abdominal distension (gaseous): Secondary | ICD-10-CM

## 2023-08-13 DIAGNOSIS — C50211 Malignant neoplasm of upper-inner quadrant of right female breast: Secondary | ICD-10-CM | POA: Diagnosis present

## 2023-08-13 DIAGNOSIS — M8589 Other specified disorders of bone density and structure, multiple sites: Secondary | ICD-10-CM

## 2023-08-13 DIAGNOSIS — M8580 Other specified disorders of bone density and structure, unspecified site: Secondary | ICD-10-CM | POA: Insufficient documentation

## 2023-08-13 LAB — CBC WITH DIFFERENTIAL (CANCER CENTER ONLY)
Abs Immature Granulocytes: 0.02 10*3/uL (ref 0.00–0.07)
Basophils Absolute: 0 10*3/uL (ref 0.0–0.1)
Basophils Relative: 0 %
Eosinophils Absolute: 0.1 10*3/uL (ref 0.0–0.5)
Eosinophils Relative: 1 %
HCT: 37.7 % (ref 36.0–46.0)
Hemoglobin: 12.6 g/dL (ref 12.0–15.0)
Immature Granulocytes: 0 %
Lymphocytes Relative: 26 %
Lymphs Abs: 1.9 10*3/uL (ref 0.7–4.0)
MCH: 30.4 pg (ref 26.0–34.0)
MCHC: 33.4 g/dL (ref 30.0–36.0)
MCV: 91.1 fL (ref 80.0–100.0)
Monocytes Absolute: 0.6 10*3/uL (ref 0.1–1.0)
Monocytes Relative: 8 %
Neutro Abs: 4.6 10*3/uL (ref 1.7–7.7)
Neutrophils Relative %: 65 %
Platelet Count: 197 10*3/uL (ref 150–400)
RBC: 4.14 MIL/uL (ref 3.87–5.11)
RDW: 13.1 % (ref 11.5–15.5)
WBC Count: 7.3 10*3/uL (ref 4.0–10.5)
nRBC: 0 % (ref 0.0–0.2)

## 2023-08-13 LAB — CMP (CANCER CENTER ONLY)
ALT: 19 U/L (ref 0–44)
AST: 19 U/L (ref 15–41)
Albumin: 4.3 g/dL (ref 3.5–5.0)
Alkaline Phosphatase: 39 U/L (ref 38–126)
Anion gap: 7 (ref 5–15)
BUN: 12 mg/dL (ref 8–23)
CO2: 27 mmol/L (ref 22–32)
Calcium: 9.3 mg/dL (ref 8.9–10.3)
Chloride: 104 mmol/L (ref 98–111)
Creatinine: 0.82 mg/dL (ref 0.44–1.00)
GFR, Estimated: >60 mL/min (ref >60)
Glucose, Bld: 96 mg/dL (ref 70–99)
Potassium: 4.1 mmol/L (ref 3.5–5.1)
Sodium: 138 mmol/L (ref 135–145)
Total Bilirubin: 0.6 mg/dL (ref 0.3–1.2)
Total Protein: 6.9 g/dL (ref 6.5–8.1)

## 2023-08-13 LAB — LIPASE, BLOOD: Lipase: 24 U/L (ref 11–51)

## 2023-08-13 LAB — AMYLASE: Amylase: 45 U/L (ref 28–100)

## 2023-08-13 NOTE — Telephone Encounter (Signed)
DXA scan order faxed to [facility]. Fax confirmation received.

## 2023-08-13 NOTE — Assessment & Plan Note (Signed)
Donna Whitney is a 67 year old woman with history of stage Ia ER/PR positive right breast invasive ductal carcinoma diagnosed in July 2020 status post lumpectomy followed by adjuvant radiation and antiestrogen therapy with anastrozole started in November 2020 followed by tamoxifen that began in February 2022.  Stage Ia right breast cancer: She has no signs of breast cancer recurrence, she will continue on tamoxifen daily and repeat mammogram when due.  We are awaiting the results from her most recent mammogram that occurred in July. Right axillary skin lesion: This area has become symptomatic and appears atypical for a sebaceous cyst suggesting a change since evaluated by Dr. Luisa Hart.  I recommended she get back in with him for removal in the next month.   Bone health: She has a history of osteopenia.  I placed orders for repeat bone density testing to occur at [facility] in September 2025 Abdominal bloating: if continues over next two weeks she will call and we will obtain CT A/P to r/o occult malignancy.  I recommended healthy diet and exercise.  We discussed the book atomic habits which helps build new habits to promote healthier behavior choices.  RTC in 1 year for f/u with Dr. Al Pimple.

## 2023-08-15 NOTE — Telephone Encounter (Signed)
-----   Message from Noreene Filbert sent at 08/15/2023 11:06 AM EDT ----- Please let Liborio Nixon know That her labs are normal ----- Message ----- From: Leory Plowman, Lab In [place] Sent: 08/13/2023  11:34 AM EDT To: Loa Socks, NP

## 2023-08-15 NOTE — Telephone Encounter (Signed)
Called pt. Per note below. Pt verbalized understanding.

## 2023-09-03 ENCOUNTER — Encounter: Payer: Self-pay | Admitting: Dermatology

## 2023-09-05 ENCOUNTER — Other Ambulatory Visit: Payer: Self-pay | Admitting: Surgery

## 2023-09-05 ENCOUNTER — Telehealth: Payer: Self-pay

## 2023-09-05 DIAGNOSIS — C50911 Malignant neoplasm of unspecified site of right female breast: Secondary | ICD-10-CM

## 2023-09-05 NOTE — Telephone Encounter (Signed)
Pt called and LVM with concerns regarding pathology report showing angiosarcoma.  Attempted to return call to pt. LVM for call back. Pathology report forwarded to NP and Dr Al Pimple.

## 2023-09-05 NOTE — Telephone Encounter (Signed)
Pt returned call and states she saw Dr Luisa Hart about "cyst" and was told he would monitor it. She saw her dermatologist for check up and showed the area of concern to them. BX obtained and showed angiosarcoma. This nurse extended my apology for this finding and advised I did send path report to Dr Al Pimple.She knows we will be in touch with her Monday.

## 2023-09-08 ENCOUNTER — Ambulatory Visit
Admission: RE | Admit: 2023-09-08 | Discharge: 2023-09-08 | Disposition: A | Discharge status: Home / Self Care | Payer: Medicare Other | Source: Ambulatory Visit | Attending: Surgery | Admitting: Surgery

## 2023-09-08 ENCOUNTER — Ambulatory Visit: Payer: Self-pay | Admitting: Surgery

## 2023-09-08 ENCOUNTER — Telehealth: Payer: Self-pay | Admitting: Hematology and Oncology

## 2023-09-08 ENCOUNTER — Encounter: Payer: Self-pay | Admitting: Surgery

## 2023-09-08 DIAGNOSIS — C50911 Malignant neoplasm of unspecified site of right female breast: Secondary | ICD-10-CM

## 2023-09-08 MED ORDER — IOPAMIDOL (ISOVUE-300) INJECTION 61%
100.0000 mL | Freq: Once | INTRAVENOUS | Status: AC | PRN
  Administered 2023-09-08: 100 mL via INTRAVENOUS

## 2023-09-08 NOTE — Telephone Encounter (Signed)
Spoke with patient confirming upcoming appointment  

## 2023-09-09 ENCOUNTER — Telehealth: Payer: Self-pay | Admitting: *Deleted

## 2023-09-09 ENCOUNTER — Other Ambulatory Visit: Payer: Medicare Other

## 2023-09-09 NOTE — Telephone Encounter (Signed)
This RN spoke with pt per her call wanting to verify her appt - which is for 10/31- which she thought was this Thursday the 24th- she states she has scheduled an appt with MD Dareen Piano to discuss her case and would like to see Dr Al Pimple prior.  "Since this is more of an unusual cancer (angiosarcoma )"  This RN rescheduled pt for this Thursday.

## 2023-09-11 ENCOUNTER — Other Ambulatory Visit: Payer: Self-pay

## 2023-09-11 ENCOUNTER — Inpatient Hospital Stay: Payer: Medicare Other | Attending: Hematology and Oncology | Admitting: Hematology and Oncology

## 2023-09-11 VITALS — BP 160/74 | HR 96 | Temp 97.5°F | Resp 16 | Wt 157.4 lb

## 2023-09-11 DIAGNOSIS — Z801 Family history of malignant neoplasm of trachea, bronchus and lung: Secondary | ICD-10-CM | POA: Diagnosis not present

## 2023-09-11 DIAGNOSIS — Z923 Personal history of irradiation: Secondary | ICD-10-CM | POA: Diagnosis not present

## 2023-09-11 DIAGNOSIS — E041 Nontoxic single thyroid nodule: Secondary | ICD-10-CM | POA: Diagnosis not present

## 2023-09-11 DIAGNOSIS — Z803 Family history of malignant neoplasm of breast: Secondary | ICD-10-CM | POA: Insufficient documentation

## 2023-09-11 DIAGNOSIS — C44509 Unspecified malignant neoplasm of skin of other part of trunk: Secondary | ICD-10-CM | POA: Diagnosis not present

## 2023-09-11 DIAGNOSIS — Z170 Estrogen receptor positive status [ER+]: Secondary | ICD-10-CM | POA: Insufficient documentation

## 2023-09-11 DIAGNOSIS — C50211 Malignant neoplasm of upper-inner quadrant of right female breast: Secondary | ICD-10-CM | POA: Diagnosis present

## 2023-09-11 DIAGNOSIS — M8580 Other specified disorders of bone density and structure, unspecified site: Secondary | ICD-10-CM | POA: Insufficient documentation

## 2023-09-11 NOTE — Progress Notes (Signed)
[facility] Cancer Follow up:    Donna Perches, MD [address] [place] [address]   DIAGNOSIS:  Cancer Staging  Malignant neoplasm of upper-inner quadrant of right breast in female, estrogen receptor positive (HCC) Staging form: Breast, AJCC 8th Edition - Clinical stage from 06/16/2019: Stage IA (cT1b, cN0, cM0, G3, ER+, PR+, HER2-) - Unsigned Stage prefix: Initial diagnosis Method of lymph node assessment: Clinical Histologic grading system: 3 grade system - Pathologic stage from 08/10/2019: Stage IA (pT1c, pN0(sn), cM0, G3, ER+, PR+, HER2-, Oncotype DX score: 24) - Unsigned Stage prefix: Initial diagnosis Method of lymph node assessment: Sentinel lymph node biopsy Multigene prognostic tests performed: Oncotype DX Recurrence score range: Greater than or equal to 11 Histologic grading system: 3 grade system   SUMMARY OF ONCOLOGIC HISTORY:  Donna Whitney, [place] woman status post right breast upper inner quadrant biopsy 06/09/2019 for a clinical T1b NX, stage IA invasive ductal carcinoma, grade 3, estrogen and progesterone receptor positive, HER-2 nonamplified, with an MRD-1 of 10%.   (1) s/p right breast lumpectomy and sentinel lymph node sampling 07/20/2019 for a pT1c pN0, stage Ia invasive ductal carcinoma, grade 3, with negative  margins             (a) a total of 5 right axillary nodes were removed   (2) Oncotype score of 24, predicting a risk of recurrence outside the breast over the next 9 years of10% if the patient's only systemic therapy is antiestrogen's for 5 years.  It also predicts no benefit from chemotherapy.   (3) adjuvant radiation 08/23/2019 - 09/17/2019             (a) right breast / 42.56 Gy in 16 fractions             (b) boost / 10 Gy in 4 fractions   (4) anastrozole started 09/2019;             (a) bone density on 08/12/2019; osteopenia T score of -1.5              (b) changed to letrozole July 2021 secondary to fatigue on anastrozole              (c) letrozole discontinued October 2021 secondary to "mental fog" and worsening GERD             (d) tamoxifen started 12/25/2020   (5) genetics testing on 06/23/2019 through thehe [facility] Common Hereditary Cancers Panel found no deleterious mutations in ATM, BRCA1, BRCA2, CDH1, CHEK2, PALB2, PTEN, STK11 and TP53.  APC, ATM, AXIN2, BARD1, BMPR1A, BRCA1, BRCA2, BRIP1, CDH1, CDKN2A (p14ARF), CDKN2A (p16INK4a), CKD4, CHEK2, CTNNA1, DICER1, EPCAM (Deletion/duplication testing only), GREM1 (promoter region deletion/duplication testing only), KIT, MEN1, MLH1, MSH2, MSH3, MSH6, MUTYH, NBN, NF1, NHTL1, PALB2, PDGFRA, PMS2, POLD1, POLE, PTEN, RAD50, RAD51C, RAD51D, RNF43, SDHB, SDHC, SDHD, SMAD4, SMARCA4. STK11, TP53, TSC1, TSC2, and VHL.  The following genes were evaluated for sequence changes only: SDHA and HOXB13 c.251G>A variant only.   CURRENT THERAPY: tamoxifen  INTERVAL HISTORY:  Donna Whitney 67 y.o. female returns for follow up of her history of breast cancer.  She was last seen by Dr. Luisa Hart and Mardella Layman and expressed a concern about a lesion in the right axilla.  She recently had skin biopsy/shave biopsy of this area and pathology commented on angiosarcoma consistent with postradiation type, deep margin involved.  There was also some squamous cell carcinoma in situ identified from other skin biopsies.  There was an atypical,  and melanocytic nevus in the skin biopsy.  She is understandably worried about the angiosarcoma and wanted to talk to [place].  She is also scheduled to see [facility] sarcoma team on November 11.  She is here with her daughter today.  She had a CT chest abdomen pelvis which did not show any clear evidence of metastatic disease, however it commented on endometrial thickness, thyroid nodule and some hypodensity in the spleen.  Rest of the pertinent 10 point ROS reviewed and negative  Patient Active Problem List   Diagnosis Date Noted   Irritable bowel syndrome 12/18/2021    Diverticulosis 11/25/2019   Genetic testing 06/23/2019   Family history of breast cancer    Family history of lung cancer    Malignant neoplasm of upper-inner quadrant of right breast in female, estrogen receptor positive (HCC) 06/14/2019   Hyperlipidemia 08/22/2017    has No Known Allergies.  MEDICAL HISTORY: Past Medical History:  Diagnosis Date   Anxiety    Cancer (HCC) 05/2019   right breast cancer   Complication of anesthesia    trouble waking up   Family history of breast cancer    Family history of lung cancer    GERD (gastroesophageal reflux disease)    Hypertension    IBS (irritable bowel syndrome)    PONV (postoperative nausea and vomiting)    Ulcerative colitis (HCC)     SURGICAL HISTORY: Past Surgical History:  Procedure Laterality Date   BREAST LUMPECTOMY WITH RADIOACTIVE SEED AND SENTINEL LYMPH NODE BIOPSY Right 07/20/2019   Procedure: RIGHT BREAST LUMPECTOMY WITH RADIOACTIVE SEED AND RIGHT SENTINEL LYMPH NODE MAPPING;  Surgeon: Harriette Bouillon, MD;  Location: [facility];  Service: General;  Laterality: Right;  PEC BLOCK   CESAREAN SECTION     TONSILLECTOMY     as a child   TUBAL LIGATION      SOCIAL HISTORY: Social History   Socioeconomic History   Marital status: Married    Spouse name: Not on file   Number of children: 2   Years of education: Not on file   Highest education level: Not on file  Occupational History   Not on file  Tobacco Use   Smoking status: Former    Passive exposure: Never   Smokeless tobacco: Never  Substance and Sexual Activity   Alcohol use: Yes    Comment: social   Drug use: Never   Sexual activity: Not on file  Other Topics Concern   Not on file  Social History Narrative   Not on file   Social Determinants of Health   Financial Resource Strain: Not on file  Food Insecurity: Not on file  Transportation Needs: Not on file  Physical Activity: Not on file  Stress: Not on file  Social Connections:  Not on file  Intimate Partner Violence: Not on file    FAMILY HISTORY: Family History  Problem Relation Age of Onset   Lung cancer Maternal Grandmother    Breast cancer Paternal Aunt        dx 5 or older    Review of Systems  Constitutional:  Negative for appetite change, chills, fatigue, fever and unexpected weight change.  HENT:   Negative for hearing loss, lump/mass and trouble swallowing.   Eyes:  Negative for eye problems and icterus.  Respiratory:  Negative for chest tightness, cough and shortness of breath.   Cardiovascular:  Negative for chest pain, leg swelling and palpitations.  Gastrointestinal:  Negative for abdominal distention, abdominal  pain, constipation, diarrhea, nausea and vomiting.  Endocrine: Negative for hot flashes.  Genitourinary:  Negative for difficulty urinating.   Musculoskeletal:  Negative for arthralgias.  Skin:  Negative for itching and rash.  Neurological:  Negative for dizziness, extremity weakness, headaches and numbness.  Hematological:  Negative for adenopathy. Does not bruise/bleed easily.  Psychiatric/Behavioral:  Negative for depression. The patient is not nervous/anxious.       PHYSICAL EXAMINATION  Vitals:   09/11/23 1036  BP: (!) 160/74  Pulse: 96  Resp: 16  Temp: (!) 97.5 F (36.4 C)  SpO2: 95%    Physical Exam Constitutional:      Appearance: Normal appearance.  HENT:     Head: Normocephalic and atraumatic.  Eyes:     General: No scleral icterus. Chest:     Comments: Area of biopsy in the right chest wall near the axilla with some scabbing. Neurological:     Mental Status: She is alert.     LABORATORY DATA:  CBC    Component Value Date/Time   WBC 7.3 08/13/2023 1127   WBC 7.1 08/07/2021 1144   RBC 4.14 08/13/2023 1127   HGB 12.6 08/13/2023 1127   HCT 37.7 08/13/2023 1127   PLT 197 08/13/2023 1127   MCV 91.1 08/13/2023 1127   MCH 30.4 08/13/2023 1127   MCHC 33.4 08/13/2023 1127   RDW 13.1 08/13/2023 1127    LYMPHSABS 1.9 08/13/2023 1127   MONOABS 0.6 08/13/2023 1127   EOSABS 0.1 08/13/2023 1127   BASOSABS 0.0 08/13/2023 1127    CMP     Component Value Date/Time   NA 138 08/13/2023 1127   K 4.1 08/13/2023 1127   CL 104 08/13/2023 1127   CO2 27 08/13/2023 1127   GLUCOSE 96 08/13/2023 1127   BUN 12 08/13/2023 1127   CREATININE 0.82 08/13/2023 1127   CALCIUM 9.3 08/13/2023 1127   PROT 6.9 08/13/2023 1127   ALBUMIN 4.3 08/13/2023 1127   AST 19 08/13/2023 1127   ALT 19 08/13/2023 1127   ALKPHOS 39 08/13/2023 1127   BILITOT 0.6 08/13/2023 1127   GFRNONAA >60 08/13/2023 1127   GFRAA >60 12/22/2019 0949   GFRAA >60 06/16/2019 1218        ASSESSMENT and THERAPY PLAN:   Malignant neoplasm of upper-inner quadrant of right breast in female, estrogen receptor positive (HCC) Donna Whitney is a 67 year old woman with history of stage Ia ER/PR positive right breast invasive ductal carcinoma diagnosed in July 2020 status post lumpectomy followed by adjuvant radiation and antiestrogen therapy with anastrozole started in November 2020 followed by tamoxifen that began in February 2022.  Stage Ia right breast cancer: She has no signs of breast cancer recurrence, I have recommended holding tamoxifen at this time given endometrial hyperplasia.  We have reached out to [facility] for further evaluation of the endometrial hyperplasia.  I have told her that this could indeed be related to tamoxifen and we would recommend an endometrial biopsy. 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  We have discussed that this is a rare side effect of adjuvant radiation.  Standard of care is to consider mastectomy in the area followed by consideration for adjuvant radiation to treat the angiosarcoma and occasionally adjuvant chemotherapy.  She is scheduled to see Dr. Daleen Bo at [facility] on November 11.  We have reviewed the CT imaging which did not  show any clear evidence of metastatic disease.  We will await  recommendations from [facility]. Thyroid nodule, this is likely benign, ultrasound of thyroid has been ordered Splenic hypodensity, once again likely benign, I will order repeat CT in 3 months for follow-up.  4 weeks follow up.    All questions were answered. The patient knows to call the clinic with any problems, questions or concerns. We can certainly see the patient much sooner if necessary.  Total encounter time:30 minutes*in face-to-face visit time, chart review, lab review, care coordination, order entry, and documentation of the encounter time.  *Total Encounter Time as defined by the Centers for Medicare and Medicaid Services includes, in addition to the face-to-face time of a patient visit (documented in the note above) non-face-to-face time: obtaining and reviewing outside history, ordering and reviewing medications, tests or procedures, care coordination (communications with other health care professionals or caregivers) and documentation in the medical record.

## 2023-09-11 NOTE — Assessment & Plan Note (Signed)
Donna Whitney is a 67 year old woman with history of stage Ia ER/PR positive right breast invasive ductal carcinoma diagnosed in July 2020 status post lumpectomy followed by adjuvant radiation and antiestrogen therapy with anastrozole started in November 2020 followed by tamoxifen that began in February 2022.  Stage Ia right breast cancer: She has no signs of breast cancer recurrence, I have recommended holding tamoxifen at this time given endometrial hyperplasia.  We have reached out to [facility] for further evaluation of the endometrial hyperplasia.  I have told her that this could indeed be related to tamoxifen and we would recommend an endometrial biopsy. 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  We have discussed that this is a rare side effect of adjuvant radiation.  Standard of care is to consider mastectomy in the area followed by consideration for adjuvant radiation to treat the angiosarcoma and occasionally adjuvant chemotherapy.  She is scheduled to see Dr. Daleen Whitney at [facility] on November 11.  We have reviewed the CT imaging which did not show any clear evidence of metastatic disease.  We will await recommendations from [facility]. Thyroid nodule, this is likely benign, ultrasound of thyroid has been ordered Splenic hypodensity, once again likely benign, I will order repeat CT in 3 months for follow-up.  4 weeks follow up.

## 2023-09-12 ENCOUNTER — Other Ambulatory Visit: Payer: Medicare Other

## 2023-09-12 ENCOUNTER — Telehealth: Payer: Self-pay | Admitting: Hematology and Oncology

## 2023-09-12 NOTE — Telephone Encounter (Signed)
 Left patient a vm regarding upcoming appointment

## 2023-09-18 ENCOUNTER — Encounter: Payer: Self-pay | Admitting: Internal Medicine

## 2023-09-18 ENCOUNTER — Ambulatory Visit: Payer: BLUE CROSS/BLUE SHIELD | Admitting: Hematology and Oncology

## 2023-10-03 ENCOUNTER — Ambulatory Visit (HOSPITAL_COMMUNITY): Payer: Medicare Other

## 2023-10-08 ENCOUNTER — Inpatient Hospital Stay: Payer: Medicare Other | Attending: Hematology and Oncology | Admitting: Hematology and Oncology

## 2023-10-08 DIAGNOSIS — Z170 Estrogen receptor positive status [ER+]: Secondary | ICD-10-CM | POA: Diagnosis not present

## 2023-10-08 DIAGNOSIS — C50211 Malignant neoplasm of upper-inner quadrant of right female breast: Secondary | ICD-10-CM | POA: Diagnosis not present

## 2023-10-08 DIAGNOSIS — E041 Nontoxic single thyroid nodule: Secondary | ICD-10-CM

## 2023-10-08 NOTE — Progress Notes (Signed)
Donna Whitney:    Donna Perches, MD [address] [place] [address]   DIAGNOSIS:  Cancer Staging  Malignant neoplasm of upper-inner quadrant of right breast in female, estrogen receptor positive (HCC) Staging form: Breast, AJCC 8th Edition - Clinical stage from 06/16/2019: Stage IA (cT1b, cN0, cM0, G3, ER+, PR+, HER2-) - Unsigned Stage prefix: Initial diagnosis Method of lymph node assessment: Clinical Histologic grading system: 3 grade system - Pathologic stage from 08/10/2019: Stage IA (pT1c, pN0(sn), cM0, G3, ER+, PR+, HER2-, Oncotype DX score: 24) - Unsigned Stage prefix: Initial diagnosis Method of lymph node assessment: Sentinel lymph node biopsy Multigene prognostic tests performed: Oncotype DX Recurrence score range: Greater than or equal to 11 Histologic grading system: 3 grade system   SUMMARY OF ONCOLOGIC HISTORY:  Donna Whitney, Donna Whitney Donna Whitney status post right breast upper inner quadrant biopsy 06/09/2019 for a clinical T1b NX, stage IA invasive ductal carcinoma, grade 3, estrogen and progesterone receptor positive, HER-2 nonamplified, with an MRD-1 of 10%.   (1) s/p right breast lumpectomy and sentinel lymph node sampling 07/20/2019 for a pT1c pN0, stage Ia invasive ductal carcinoma, grade 3, with negative  margins             (a) a total of 5 right axillary nodes were removed   (2) Oncotype score of 24, predicting a risk of recurrence outside the breast over the next 9 years of10% if the patient's only systemic therapy is antiestrogen's for 5 years.  It also predicts no benefit from chemotherapy.   (3) adjuvant radiation 08/23/2019 - 09/17/2019             (a) right breast / 42.56 Gy in 16 fractions             (b) boost / 10 Gy in 4 fractions   (4) anastrozole started 09/2019;             (a) bone density on 08/12/2019; osteopenia T score of -1.5              (b) changed to letrozole July 2021 secondary to fatigue on anastrozole              (c) letrozole discontinued October 2021 secondary to "mental fog" and worsening GERD             (d) tamoxifen started 12/25/2020   (5) genetics testing on 06/23/2019 through thehe [facility] Common Hereditary Cancers Panel found no deleterious mutations in ATM, BRCA1, BRCA2, CDH1, CHEK2, PALB2, PTEN, STK11 and TP53.  APC, ATM, AXIN2, BARD1, BMPR1A, BRCA1, BRCA2, BRIP1, CDH1, CDKN2A (p14ARF), CDKN2A (p16INK4a), CKD4, CHEK2, CTNNA1, DICER1, EPCAM (Deletion/duplication testing only), GREM1 (promoter region deletion/duplication testing only), KIT, MEN1, MLH1, MSH2, MSH3, MSH6, MUTYH, NBN, NF1, NHTL1, PALB2, PDGFRA, PMS2, POLD1, POLE, PTEN, RAD50, RAD51C, RAD51D, RNF43, SDHB, SDHC, SDHD, SMAD4, SMARCA4. STK11, TP53, TSC1, TSC2, and VHL.  The following genes were evaluated for sequence changes only: SDHA and HOXB13 c.251G>A variant only.   CURRENT THERAPY: observation  INTERVAL HISTORY:  Donna Whitney 67 y.o. female returns for follow Whitney of her history of breast cancer.  She was last seen by Dr. Luisa Hart and Mardella Layman and expressed a concern about a lesion in the right axilla.  She recently had skin biopsy/shave biopsy of this area and pathology commented on angiosarcoma consistent with postradiation type, deep margin involved.  Since her last visit here, she went to see Dr. Willette Brace at [facility], she was initially scheduled with  Dr. Daleen Bo.  Apparently the recommendation was to pursue mastectomy, there was no comment if she would need adjuvant radiation.  I do not see a multidisciplinary note from [facility] yet.  She is tells me that she also had an ultrasound which discovered a thyroid nodule and recommendation was to consider an FNA but she was hoping we can do it locally.  She would like to pursue surgery at [facility], this is scheduled for the first week of January.  Rest of the pertinent 10 point ROS reviewed and negative  Patient Active Problem List   Diagnosis Date Noted   Irritable bowel  syndrome 12/18/2021   Diverticulosis 11/25/2019   Genetic testing 06/23/2019   Family history of breast cancer    Family history of lung cancer    Malignant neoplasm of upper-inner quadrant of right breast in female, estrogen receptor positive (HCC) 06/14/2019   Hyperlipidemia 08/22/2017    has No Known Allergies.  MEDICAL HISTORY: Past Medical History:  Diagnosis Date   Anxiety    Cancer (HCC) 05/2019   right breast cancer   Complication of anesthesia    trouble waking Whitney   Family history of breast cancer    Family history of lung cancer    GERD (gastroesophageal reflux disease)    Hypertension    IBS (irritable bowel syndrome)    PONV (postoperative nausea and vomiting)    Ulcerative colitis (HCC)     SURGICAL HISTORY: Past Surgical History:  Procedure Laterality Date   BREAST LUMPECTOMY WITH RADIOACTIVE SEED AND SENTINEL LYMPH NODE BIOPSY Right 07/20/2019   Procedure: RIGHT BREAST LUMPECTOMY WITH RADIOACTIVE SEED AND RIGHT SENTINEL LYMPH NODE MAPPING;  Surgeon: Harriette Bouillon, MD;  Location: [facility];  Service: General;  Laterality: Right;  PEC BLOCK   CESAREAN SECTION     TONSILLECTOMY     as a child   TUBAL LIGATION      SOCIAL HISTORY: Social History   Socioeconomic History   Marital status: Married    Spouse name: Not on file   Number of children: 2   Years of education: Not on file   Highest education level: Not on file  Occupational History   Not on file  Tobacco Use   Smoking status: Former    Passive exposure: Never   Smokeless tobacco: Never  Substance and Sexual Activity   Alcohol use: Yes    Comment: social   Drug use: Never   Sexual activity: Not on file  Other Topics Concern   Not on file  Social History Narrative   Not on file   Social Determinants of Health   Financial Resource Strain: Not on file  Food Insecurity: Not on file  Transportation Needs: Not on file  Physical Activity: Not on file  Stress: Not on file   Social Connections: Not on file  Intimate Partner Violence: Not on file    FAMILY HISTORY: Family History  Problem Relation Age of Onset   Lung cancer Maternal Grandmother    Breast cancer Paternal Aunt        dx 25 or older    Review of Systems  Constitutional:  Negative for appetite change, chills, fatigue, fever and unexpected weight change.  HENT:   Negative for hearing loss, lump/mass and trouble swallowing.   Eyes:  Negative for eye problems and icterus.  Respiratory:  Negative for chest tightness, cough and shortness of breath.   Cardiovascular:  Negative for chest pain, leg swelling and  palpitations.  Gastrointestinal:  Negative for abdominal distention, abdominal pain, constipation, diarrhea, nausea and vomiting.  Endocrine: Negative for hot flashes.  Genitourinary:  Negative for difficulty urinating.   Musculoskeletal:  Negative for arthralgias.  Skin:  Negative for itching and rash.  Neurological:  Negative for dizziness, extremity weakness, headaches and numbness.  Hematological:  Negative for adenopathy. Does not bruise/bleed easily.  Psychiatric/Behavioral:  Negative for depression. The patient is not nervous/anxious.       PHYSICAL EXAMINATION  There were no vitals filed for this visit.  PE deferred, telephone visit   LABORATORY DATA:  CBC    Component Value Date/Time   WBC 7.3 08/13/2023 1127   WBC 7.1 08/07/2021 1144   RBC 4.14 08/13/2023 1127   HGB 12.6 08/13/2023 1127   HCT 37.7 08/13/2023 1127   PLT 197 08/13/2023 1127   MCV 91.1 08/13/2023 1127   MCH 30.4 08/13/2023 1127   MCHC 33.4 08/13/2023 1127   RDW 13.1 08/13/2023 1127   LYMPHSABS 1.9 08/13/2023 1127   MONOABS 0.6 08/13/2023 1127   EOSABS 0.1 08/13/2023 1127   BASOSABS 0.0 08/13/2023 1127    CMP     Component Value Date/Time   NA 138 08/13/2023 1127   K 4.1 08/13/2023 1127   CL 104 08/13/2023 1127   CO2 27 08/13/2023 1127   GLUCOSE 96 08/13/2023 1127   BUN 12 08/13/2023  1127   CREATININE 0.82 08/13/2023 1127   CALCIUM 9.3 08/13/2023 1127   PROT 6.9 08/13/2023 1127   ALBUMIN 4.3 08/13/2023 1127   AST 19 08/13/2023 1127   ALT 19 08/13/2023 1127   ALKPHOS 39 08/13/2023 1127   BILITOT 0.6 08/13/2023 1127   GFRNONAA >60 08/13/2023 1127   GFRAA >60 12/22/2019 0949   GFRAA >60 06/16/2019 1218        ASSESSMENT and THERAPY PLAN:   Malignant neoplasm of upper-inner quadrant of right breast in female, estrogen receptor positive (HCC) Donna Whitney is a 67 year old Donna Whitney with history of stage Ia ER/PR positive right breast invasive ductal carcinoma diagnosed in July 2020 status post lumpectomy followed by adjuvant radiation and antiestrogen therapy with anastrozole started in November 2020 followed by tamoxifen that began in February 2022.  Stage Ia right breast cancer: She has no signs of breast cancer recurrence, I have recommended holding tamoxifen at this time given endometrial hyperplasia.  We have reached out to [facility] for further evaluation of the endometrial hyperplasia.  Currently her tamoxifen is held.  Will reach out back to Dr. Lilyan Punt office about any updates on this.   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  We have discussed that this is a rare side effect of adjuvant radiation.  Standard of care is to consider mastectomy in the area followed by consideration for adjuvant radiation to treat the angiosarcoma. She met with one of the Assistant Professor Dr. Mechele Collin at [facility] and her surgeon, they have also recommended a mastectomy.  However there was no mention of any adjuvant radiation.  She mentions that she is yet to be presented in the multidisciplinary tumor board for additional recommendations.  She is hoping to do surgery at [facility]. Thyroid nodule, recommend FNA, this has been ordered. Splenic hypodensity, once again likely benign, I will order repeat CT in 3 months for  follow-Whitney.  She will keep [place] posted about updates  I connected with  Donna Whitney on 10/08/23 by a telemedicine application and verified that  I am speaking with the correct person using two identifiers.   I discussed the limitations of evaluation and management by telemedicine. The patient expressed understanding and agreed to proceed.  All questions were answered. The patient knows to call the clinic with any problems, questions or concerns. We can certainly see the patient much sooner if necessary.  Total encounter time:10 minutes*in face-to-face visit time, chart review, lab review, care coordination, order entry, and documentation of the encounter time.  *Total Encounter Time as defined by the Centers for Medicare and Medicaid Services includes, in addition to the face-to-face time of a patient visit (documented in the note above) non-face-to-face time: obtaining and reviewing outside history, ordering and reviewing medications, tests or procedures, care coordination (communications with other health care professionals or caregivers) and documentation in the medical record.

## 2023-10-08 NOTE — Assessment & Plan Note (Addendum)
Donna Whitney is a 67 year old woman with history of stage Ia ER/PR positive right breast invasive ductal carcinoma diagnosed in July 2020 status post lumpectomy followed by adjuvant radiation and antiestrogen therapy with anastrozole started in November 2020 followed by tamoxifen that began in February 2022.  Stage Ia right breast cancer: She has no signs of breast cancer recurrence, I have recommended holding tamoxifen at this time given endometrial hyperplasia.  We have reached out to Dr[facility] Lily[facility] for further evaluation of the endometrial hyperplasia.  Currently her tamoxifen is held.  Will reach out back to Dr. Lilyan Punt office about any updates on this.   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  We have discussed that this is a rare side effect of adjuvant radiation.  Standard of care is to consider mastectomy in the area followed by consideration for adjuvant radiation to treat the angiosarcoma. She met with one of the Assistant Professor Dr. Mechele Collin at [facility] and her surgeon, they have also recommended a mastectomy.  However there was no mention of any adjuvant radiation.  She mentions that she is yet to be presented in the multidisciplinary tumor board for additional recommendations.  She is hoping to do surgery at [facility]. Thyroid nodule, recommend FNA, this has been ordered. Splenic hypodensity, once again likely benign, I will order repeat CT in 3 months for follow-up.  She will keep [place] posted about updates

## 2023-10-09 ENCOUNTER — Encounter: Payer: Self-pay | Admitting: *Deleted

## 2023-11-03 ENCOUNTER — Telehealth: Payer: Self-pay | Admitting: *Deleted

## 2023-11-03 ENCOUNTER — Other Ambulatory Visit: Payer: Self-pay | Admitting: *Deleted

## 2023-11-03 ENCOUNTER — Encounter: Payer: Self-pay | Admitting: *Deleted

## 2023-11-03 NOTE — Telephone Encounter (Signed)
This RN was contacted by Gearldine Bienenstock from [facility] stating they need the images of thyroid scan done at outside facility to proceed with biopsy order.  This RN reviewed chart and noted pt had thyroid scan on 09/30/2023 at [facility] - this RN contacted [facility] at [phone] and was asked to fax request to [phone].  Above request faxed per letter.

## 2023-11-27 ENCOUNTER — Other Ambulatory Visit: Payer: Self-pay | Admitting: *Deleted

## 2023-11-27 ENCOUNTER — Telehealth: Payer: Self-pay | Admitting: *Deleted

## 2023-11-27 NOTE — Telephone Encounter (Signed)
 This RN was contacted approximately 2 weeks ago from [facility] that requested film for coordination of needed biopsy has not been received there biopsy has not been scheduled.  This RN contacted [facility] and requested films to be upload to [employer] due to urgent need.  A few days post above the CD of [facility] films disc was on [employer].  On 1/7 - called again by [facility] stating need of film- informed them request was made to upload to [employer] - but informed records not available.  Today this  RN physically took the CD to [facility] and gave it to Roseline Blush who states she will give it to [facility] for appt to be scheduled.

## 2024-01-28 ENCOUNTER — Other Ambulatory Visit: Payer: BLUE CROSS/BLUE SHIELD

## 2024-03-05 ENCOUNTER — Inpatient Hospital Stay: Admission: RE | Admit: 2024-03-05 | Source: Ambulatory Visit

## 2024-03-16 ENCOUNTER — Ambulatory Visit
Admission: RE | Admit: 2024-03-16 | Discharge: 2024-03-16 | Disposition: A | Discharge status: Home / Self Care | Source: Ambulatory Visit | Attending: Hematology and Oncology | Admitting: Hematology and Oncology

## 2024-03-16 ENCOUNTER — Other Ambulatory Visit (HOSPITAL_COMMUNITY)
Admission: RE | Admit: 2024-03-16 | Discharge: 2024-03-16 | Disposition: A | Discharge status: Home / Self Care | Source: Ambulatory Visit | Attending: Hematology and Oncology | Admitting: Hematology and Oncology

## 2024-03-16 DIAGNOSIS — E041 Nontoxic single thyroid nodule: Secondary | ICD-10-CM | POA: Insufficient documentation

## 2024-03-17 LAB — CYTOLOGY - NON PAP

## 2024-09-06 ENCOUNTER — Encounter: Payer: Self-pay | Admitting: Hematology and Oncology
